# Patient Record
Sex: Male | Born: 1935 | Race: Black or African American | Hispanic: No | Marital: Married | Smoking: Former smoker
Health system: Southern US, Community
[De-identification: ages and names within clinical notes are randomized; demographics above are authoritative.]

## PROBLEM LIST (undated history)

## (undated) DIAGNOSIS — N189 Chronic kidney disease, unspecified: Secondary | ICD-10-CM

## (undated) DIAGNOSIS — E785 Hyperlipidemia, unspecified: Secondary | ICD-10-CM

## (undated) DIAGNOSIS — I10 Essential (primary) hypertension: Secondary | ICD-10-CM

## (undated) HISTORY — PX: EYE SURGERY: SHX253

## (undated) HISTORY — DX: Hyperlipidemia, unspecified: E78.5

## (undated) HISTORY — DX: Essential (primary) hypertension: I10

## (undated) HISTORY — DX: Chronic kidney disease, unspecified: N18.9

---

## 1998-12-02 ENCOUNTER — Emergency Department (HOSPITAL_COMMUNITY): Admission: EM | Admit: 1998-12-02 | Discharge: 1998-12-02 | Payer: Self-pay | Admitting: Emergency Medicine

## 2016-11-07 LAB — HM DIABETES EYE EXAM

## 2016-12-11 ENCOUNTER — Ambulatory Visit: Payer: Self-pay | Admitting: Internal Medicine

## 2017-01-09 ENCOUNTER — Other Ambulatory Visit (INDEPENDENT_AMBULATORY_CARE_PROVIDER_SITE_OTHER): Payer: Medicare Other

## 2017-01-09 ENCOUNTER — Ambulatory Visit (INDEPENDENT_AMBULATORY_CARE_PROVIDER_SITE_OTHER): Payer: Medicare Other | Admitting: Internal Medicine

## 2017-01-09 ENCOUNTER — Encounter: Payer: Self-pay | Admitting: Internal Medicine

## 2017-01-09 VITALS — BP 130/74 | HR 69 | Temp 98.7°F | Resp 16 | Ht 72.0 in | Wt 177.2 lb

## 2017-01-09 DIAGNOSIS — I129 Hypertensive chronic kidney disease with stage 1 through stage 4 chronic kidney disease, or unspecified chronic kidney disease: Secondary | ICD-10-CM

## 2017-01-09 DIAGNOSIS — N289 Disorder of kidney and ureter, unspecified: Secondary | ICD-10-CM | POA: Insufficient documentation

## 2017-01-09 DIAGNOSIS — E785 Hyperlipidemia, unspecified: Secondary | ICD-10-CM

## 2017-01-09 DIAGNOSIS — H4010X0 Unspecified open-angle glaucoma, stage unspecified: Secondary | ICD-10-CM | POA: Diagnosis not present

## 2017-01-09 DIAGNOSIS — E118 Type 2 diabetes mellitus with unspecified complications: Secondary | ICD-10-CM

## 2017-01-09 DIAGNOSIS — Z23 Encounter for immunization: Secondary | ICD-10-CM | POA: Diagnosis not present

## 2017-01-09 LAB — URINALYSIS, ROUTINE W REFLEX MICROSCOPIC
BILIRUBIN URINE: NEGATIVE
HGB URINE DIPSTICK: NEGATIVE
KETONES UR: NEGATIVE
LEUKOCYTES UA: NEGATIVE
NITRITE: NEGATIVE
RBC / HPF: NONE SEEN (ref 0–?)
Specific Gravity, Urine: <=1.005 — AB (ref 1.000–1.030)
TOTAL PROTEIN, URINE-UPE24: NEGATIVE
URINE GLUCOSE: NEGATIVE
UROBILINOGEN UA: 0.2 (ref 0.0–1.0)
pH: 7 (ref 5.0–8.0)

## 2017-01-09 LAB — CBC WITH DIFFERENTIAL/PLATELET
BASOS PCT: 0.5 % (ref 0.0–3.0)
Basophils Absolute: 0 10*3/uL (ref 0.0–0.1)
EOS PCT: 1.8 % (ref 0.0–5.0)
Eosinophils Absolute: 0.1 10*3/uL (ref 0.0–0.7)
HCT: 38.3 % — ABNORMAL LOW (ref 39.0–52.0)
HEMOGLOBIN: 12.6 g/dL — AB (ref 13.0–17.0)
LYMPHS ABS: 0.9 10*3/uL (ref 0.7–4.0)
Lymphocytes Relative: 15.7 % (ref 12.0–46.0)
MCHC: 33 g/dL (ref 30.0–36.0)
MCV: 95.9 fl (ref 78.0–100.0)
MONO ABS: 0.8 10*3/uL (ref 0.1–1.0)
Monocytes Relative: 12.6 % — ABNORMAL HIGH (ref 3.0–12.0)
NEUTROS ABS: 4.2 10*3/uL (ref 1.4–7.7)
NEUTROS PCT: 69.4 % (ref 43.0–77.0)
PLATELETS: 130 10*3/uL — AB (ref 150.0–400.0)
RBC: 3.99 Mil/uL — ABNORMAL LOW (ref 4.22–5.81)
RDW: 13.8 % (ref 11.5–15.5)
WBC: 6 10*3/uL (ref 4.0–10.5)

## 2017-01-09 LAB — COMPREHENSIVE METABOLIC PANEL
ALT: 12 U/L (ref 0–53)
AST: 13 U/L (ref 0–37)
Albumin: 4.2 g/dL (ref 3.5–5.2)
Alkaline Phosphatase: 63 U/L (ref 39–117)
BUN: 38 mg/dL — AB (ref 6–23)
CHLORIDE: 105 meq/L (ref 96–112)
CO2: 27 meq/L (ref 19–32)
Calcium: 10 mg/dL (ref 8.4–10.5)
Creatinine, Ser: 2.98 mg/dL — ABNORMAL HIGH (ref 0.40–1.50)
GFR: 25.84 mL/min — ABNORMAL LOW (ref >60.00)
GLUCOSE: 86 mg/dL (ref 70–99)
POTASSIUM: 4.1 meq/L (ref 3.5–5.1)
SODIUM: 141 meq/L (ref 135–145)
Total Bilirubin: 0.5 mg/dL (ref 0.2–1.2)
Total Protein: 7.3 g/dL (ref 6.0–8.3)

## 2017-01-09 LAB — MICROALBUMIN / CREATININE URINE RATIO
Creatinine,U: 35.9 mg/dL
MICROALB UR: 0.7 mg/dL (ref 0.0–1.9)
Microalb Creat Ratio: 2 mg/g (ref 0.0–30.0)

## 2017-01-09 LAB — LIPID PANEL
Cholesterol: 154 mg/dL (ref 0–200)
HDL: 54 mg/dL (ref >39.00)
LDL CALC: 85 mg/dL (ref 0–99)
NONHDL: 99.69
Total CHOL/HDL Ratio: 3
Triglycerides: 75 mg/dL (ref 0.0–149.0)
VLDL: 15 mg/dL (ref 0.0–40.0)

## 2017-01-09 LAB — HEMOGLOBIN A1C: Hgb A1c MFr Bld: 6.1 % (ref 4.6–6.5)

## 2017-01-09 LAB — THYROID PANEL WITH TSH
Free Thyroxine Index: 2 (ref 1.4–3.8)
T3 Uptake: 29 % (ref 22–35)
T4, Total: 6.9 ug/dL (ref 4.9–10.5)
TSH: 2.13 m[IU]/L (ref 0.40–4.50)

## 2017-01-09 NOTE — Patient Instructions (Signed)
Diabetes Mellitus and Food It is important for you to manage your blood sugar (glucose) level. Your blood glucose level can be greatly affected by what you eat. Eating healthier foods in the appropriate amounts throughout the day at about the same time each day will help you control your blood glucose level. It can also help slow or prevent worsening of your diabetes mellitus. Healthy eating may even help you improve the level of your blood pressure and reach or maintain a healthy weight. General recommendations for healthful eating and cooking habits include:  Eating meals and snacks regularly. Avoid going long periods of time without eating to lose weight.  Eating a diet that consists mainly of plant-based foods, such as fruits, vegetables, nuts, legumes, and whole grains.  Using low-heat cooking methods, such as baking, instead of high-heat cooking methods, such as deep frying.  Work with your dietitian to make sure you understand how to use the Nutrition Facts information on food labels. How can food affect me? Carbohydrates Carbohydrates affect your blood glucose level more than any other type of food. Your dietitian will help you determine how many carbohydrates to eat at each meal and teach you how to count carbohydrates. Counting carbohydrates is important to keep your blood glucose at a healthy level, especially if you are using insulin or taking certain medicines for diabetes mellitus. Alcohol Alcohol can cause sudden decreases in blood glucose (hypoglycemia), especially if you use insulin or take certain medicines for diabetes mellitus. Hypoglycemia can be a life-threatening condition. Symptoms of hypoglycemia (sleepiness, dizziness, and disorientation) are similar to symptoms of having too much alcohol. If your health care provider has given you approval to drink alcohol, do so in moderation and use the following guidelines:  Women should not have more than one drink per day, and men  should not have more than two drinks per day. One drink is equal to: ? 12 oz of beer. ? 5 oz of wine. ? 1 oz of hard liquor.  Do not drink on an empty stomach.  Keep yourself hydrated. Have water, diet soda, or unsweetened iced tea.  Regular soda, juice, and other mixers might contain a lot of carbohydrates and should be counted.  What foods are not recommended? As you make food choices, it is important to remember that all foods are not the same. Some foods have fewer nutrients per serving than other foods, even though they might have the same number of calories or carbohydrates. It is difficult to get your body what it needs when you eat foods with fewer nutrients. Examples of foods that you should avoid that are high in calories and carbohydrates but low in nutrients include:  Trans fats (most processed foods list trans fats on the Nutrition Facts label).  Regular soda.  Juice.  Candy.  Sweets, such as cake, pie, doughnuts, and cookies.  Fried foods.  What foods can I eat? Eat nutrient-rich foods, which will nourish your body and keep you healthy. The food you should eat also will depend on several factors, including:  The calories you need.  The medicines you take.  Your weight.  Your blood glucose level.  Your blood pressure level.  Your cholesterol level.  You should eat a variety of foods, including:  Protein. ? Lean cuts of meat. ? Proteins low in saturated fats, such as fish, egg whites, and beans. Avoid processed meats.  Fruits and vegetables. ? Fruits and vegetables that may help control blood glucose levels, such as apples,   mangoes, and yams.  Dairy products. ? Choose fat-free or low-fat dairy products, such as milk, yogurt, and cheese.  Grains, bread, pasta, and rice. ? Choose whole grain products, such as multigrain bread, whole oats, and brown rice. These foods may help control blood pressure.  Fats. ? Foods containing healthful fats, such as  nuts, avocado, olive oil, canola oil, and fish.  Does everyone with diabetes mellitus have the same meal plan? Because every person with diabetes mellitus is different, there is not one meal plan that works for everyone. It is very important that you meet with a dietitian who will help you create a meal plan that is just right for you. This information is not intended to replace advice given to you by your health care provider. Make sure you discuss any questions you have with your health care provider. Document Released: 12/07/2004 Document Revised: 08/18/2015 Document Reviewed: 02/06/2013 Elsevier Interactive Patient Education  2017 [employer]vier Inc.  

## 2017-01-09 NOTE — Progress Notes (Signed)
Subjective:  Patient ID: Cory Leonard, male    DOB: 01/10/1929  Age: 83 y.o. MRN: [id number]  CC: Hypertension; Hyperlipidemia; and Diabetes   HPI Cory Leonard presents for establishing as a new patient.  He recently moved here from [place] to live near his daughter.  He has a history of diabetes, hypertension, chronic kidney disease, and glaucoma.  He asked that I would refer him to a local ophthalmologist and nephrologist.  There are no old records from [place] today.  He feels well today.  He offers no complaints.  History Cory Leonard has a past medical history of Chronic kidney disease; Hyperlipidemia; and Hypertension.   He has no past surgical history on file.   His Family history is unknown by patient.He reports that he has never smoked. He has never used smokeless tobacco. He reports that he does not drink alcohol or use drugs.  No outpatient prescriptions prior to visit.   No facility-administered medications prior to visit.     ROS Review of Systems  Constitutional: Negative.  Negative for appetite change, diaphoresis, fatigue and unexpected weight change.  HENT: Negative.   Eyes: Negative for visual disturbance.  Respiratory: Negative for cough, chest tightness, shortness of breath and wheezing.   Cardiovascular: Negative for chest pain, palpitations and leg swelling.  Gastrointestinal: Negative for abdominal pain, constipation, diarrhea, nausea and vomiting.  Endocrine: Negative.  Negative for polyphagia and polyuria.  Genitourinary: Negative.  Negative for difficulty urinating.  Musculoskeletal: Negative.  Negative for back pain and myalgias.  Skin: Negative.  Negative for color change and rash.  Allergic/Immunologic: Negative.   Neurological: Negative.  Negative for dizziness, weakness, light-headedness and headaches.  Hematological: Negative for adenopathy. Does not bruise/bleed easily.  Psychiatric/Behavioral: Negative.     Objective:  BP 130/74 (BP  Location: Left Arm, Patient Position: Sitting, Cuff Size: Normal)   Pulse 69   Temp 98.7 F (37.1 C) (Oral)   Resp 16   Ht 6' (1.829 m)   Wt 177 lb 4 oz (80.4 kg)   SpO2 96%   BMI 24.04 kg/m   Physical Exam  Constitutional: He is oriented to person, place, and time. No distress.  HENT:  Mouth/Throat: Oropharynx is clear and moist. No oropharyngeal exudate.  Eyes: Conjunctivae are normal. Right eye exhibits no discharge. Left eye exhibits no discharge. No scleral icterus.  Neck: Normal range of motion. Neck supple. No JVD present. No thyromegaly present.  Cardiovascular: Normal rate, regular rhythm and intact distal pulses.  Exam reveals no gallop and no friction rub.   No murmur heard. Pulmonary/Chest: Effort normal and breath sounds normal. No respiratory distress. He has no wheezes. He has no rales. He exhibits no tenderness.  Abdominal: Soft. Bowel sounds are normal. He exhibits no distension and no mass. There is no tenderness. There is no rebound and no guarding.  Musculoskeletal: Normal range of motion. He exhibits no edema, tenderness or deformity.  Lymphadenopathy:    He has no cervical adenopathy.  Neurological: He is alert and oriented to person, place, and time.  Skin: Skin is warm and dry. No rash noted. He is not diaphoretic. No erythema. No pallor.  Vitals reviewed.     Lab Results  Component Value Date   WBC 6.0 01/09/2017   HGB 12.6 (L) 01/09/2017   HCT 38.3 (L) 01/09/2017   PLT 130.0 (L) 01/09/2017   GLUCOSE 86 01/09/2017   CHOL 154 01/09/2017   TRIG 75.0 01/09/2017   HDL 54.00 01/09/2017  LDLCALC 85 01/09/2017   ALT 12 01/09/2017   AST 13 01/09/2017   NA 141 01/09/2017   K 4.1 01/09/2017   CL 105 01/09/2017   CREATININE 2.98 (H) 01/09/2017   BUN 38 (H) 01/09/2017   CO2 27 01/09/2017   TSH 2.13 01/09/2017   HGBA1C 6.1 01/09/2017   MICROALBUR 0.7 01/09/2017     Assessment & Plan:   Cory Leonard was seen today for hypertension, hyperlipidemia and  diabetes.  Diagnoses and all orders for this visit:  Type 2 diabetes mellitus with complication, without long-term current use of insulin (HCC)-his A1c is at 6.1%.  His blood sugars are well controlled. -     Comprehensive metabolic panel; Future -     Hemoglobin A1c; Future -     Microalbumin / creatinine urine ratio; Future  Renal insufficiency, mild-his creatinine clearance is at 19.  He will avoid nephrotoxic agents.  I have referred him to a local nephrologist. -     Comprehensive metabolic panel; Future -     CBC with Differential/Platelet; Future -     Urinalysis, Routine w reflex microscopic; Future -     Ambulatory referral to Nephrology  Hyperlipidemia LDL goal <100-statin treatment is not indicated. -     Lipid panel; Future -     Thyroid Panel With TSH; Future  Hypertension, renal disease-his blood pressure is adequately well controlled. -     CBC with Differential/Platelet; Future  Open-angle glaucoma of both eyes, unspecified glaucoma stage, unspecified open-angle glaucoma type -     Ambulatory referral to Ophthalmology  Need for influenza vaccination -     Flu vaccine HIGH DOSE PF (Fluzone High dose)  Need for Tdap vaccination -     Tdap vaccine greater than or equal to 7yo IM   I have discontinued Cory Leonard amLODipine and hydrochlorothiazide. I am also having him maintain his lisinopril-hydrochlorothiazide, simvastatin, calcium citrate, calcium-vitamin D, cyanocobalamin, timolol, and neomycin-polymyxin b-dexamethasone.  Meds ordered this encounter  Medications  . DISCONTD: amLODipine (NORVASC) 5 MG tablet    Sig: Take 5 mg by mouth daily.    Refill:  1  . lisinopril-hydrochlorothiazide (PRINZIDE,ZESTORETIC) 20-25 MG tablet    Sig: TAKE 1/2 OF A TABLET BY MOUTH EVERY DAY    Refill:  1  . simvastatin (ZOCOR) 10 MG tablet    Sig: Take 10 mg by mouth daily.    Refill:  1  . DISCONTD: hydrochlorothiazide (HYDRODIURIL) 25 MG tablet    Sig: Take by mouth  daily.  . calcium citrate (CALCITRATE - DOSED IN MG ELEMENTAL CALCIUM) 950 MG tablet    Sig: Take 200 mg of elemental calcium by mouth daily.  . Calcium Carb-Cholecalciferol (CALCIUM-VITAMIN D) 500-200 MG-UNIT tablet    Sig: Take 1 tablet by mouth daily.  . cyanocobalamin 500 MCG tablet    Sig: Take 500 mcg by mouth daily.  . timolol (BETIMOL) 0.5 % ophthalmic solution    Sig: 1 drop 2 (two) times daily.  Marland Kitchen neomycin-polymyxin b-dexamethasone (MAXITROL) 3.5-10000-0.1 OINT    Sig: 1 application.     Follow-up: Return in about 4 months (around 05/12/2017).  Scarlette Calico, MD

## 2017-01-10 ENCOUNTER — Encounter: Payer: Self-pay | Admitting: Internal Medicine

## 2017-01-15 ENCOUNTER — Encounter: Payer: Self-pay | Admitting: Internal Medicine

## 2017-02-06 ENCOUNTER — Other Ambulatory Visit: Payer: Self-pay | Admitting: Internal Medicine

## 2017-02-06 ENCOUNTER — Telehealth: Payer: Self-pay

## 2017-02-06 NOTE — Telephone Encounter (Signed)
TJ-Pt wife asked for a referral to a kidney doctor/he saw one prior to moving here and is in need of one here in [place]/plz advise/thx dmf

## 2017-02-06 NOTE — Telephone Encounter (Signed)
This has been ordered per TJ/thx dmf

## 2017-02-06 NOTE — Telephone Encounter (Signed)
I entered this referral request 1 month ago

## 2017-02-06 NOTE — Telephone Encounter (Signed)
Spoke with Museum/gallery conservator at [employer] [employer] and pt is rated a 3 and it still could be a few weeks before the pt is called to schedule an appointment. Pt is aware

## 2017-02-06 NOTE — Telephone Encounter (Signed)
Referral was sent to [facility] on 10/26. I called to follow up and left a msg on their referral coordinator's vm to call me back.

## 2017-02-06 NOTE — Telephone Encounter (Signed)
LMOVM that someone is calling the kidney office to advise on referral process/thx dmf

## 2017-02-26 LAB — HM DIABETES EYE EXAM

## 2017-03-07 ENCOUNTER — Encounter: Payer: Self-pay | Admitting: Internal Medicine

## 2017-05-14 ENCOUNTER — Ambulatory Visit: Payer: Medicare Other | Admitting: Internal Medicine

## 2017-05-14 LAB — HEPATIC FUNCTION PANEL
ALT: 10 (ref 10–40)
AST: 11 — AB (ref 14–40)
Alkaline Phosphatase: 72 (ref 25–125)

## 2017-05-14 LAB — VITAMIN D 25 HYDROXY (VIT D DEFICIENCY, FRACTURES): Vit D, 25-Hydroxy: 49.8

## 2017-05-14 LAB — CBC AND DIFFERENTIAL
HCT: 36 — AB (ref 41–53)
Hemoglobin: 12.4 — AB (ref 13.5–17.5)
Platelets: 128 — AB (ref 150–399)
WBC: 5.4

## 2017-05-14 LAB — BASIC METABOLIC PANEL
BUN: 38 — AB (ref 4–21)
CREATININE: 3.1 — AB (ref 0.6–1.3)
Potassium: 4.6 (ref 3.4–5.3)
Sodium: 138 (ref 137–147)

## 2017-05-14 LAB — IRON,TIBC AND FERRITIN PANEL
Iron: 85
TIBC: 248
UIBC: 163

## 2017-05-16 ENCOUNTER — Ambulatory Visit: Payer: Medicare Other | Admitting: Internal Medicine

## 2017-05-30 ENCOUNTER — Ambulatory Visit: Payer: Medicare Other | Admitting: Internal Medicine

## 2017-05-30 ENCOUNTER — Encounter: Payer: Self-pay | Admitting: Internal Medicine

## 2017-05-30 VITALS — BP 148/78 | HR 78 | Temp 98.0°F | Resp 16 | Ht 72.0 in | Wt 175.5 lb

## 2017-05-30 DIAGNOSIS — Z23 Encounter for immunization: Secondary | ICD-10-CM

## 2017-05-30 DIAGNOSIS — I129 Hypertensive chronic kidney disease with stage 1 through stage 4 chronic kidney disease, or unspecified chronic kidney disease: Secondary | ICD-10-CM | POA: Diagnosis not present

## 2017-05-30 DIAGNOSIS — D539 Nutritional anemia, unspecified: Secondary | ICD-10-CM | POA: Diagnosis not present

## 2017-05-30 DIAGNOSIS — E118 Type 2 diabetes mellitus with unspecified complications: Secondary | ICD-10-CM | POA: Diagnosis not present

## 2017-05-30 NOTE — Progress Notes (Signed)
Subjective:  Patient ID: Cory Leonard, male    DOB: 1929/03/07  Age: 84 y.o. MRN: [id number]  CC: Anemia and Hypertension   HPI Cory Leonard presents for f/up - He tells me that he feels well and offers no complaints.  He thinks his blood pressure has been well controlled.  He is not symptomatic with respect to the anemia.  He is not aware of any sources of blood loss.  He denies fatigue, edema, paresthesias, weakness, or shortness of breath.  Outpatient Medications Prior to Visit  Medication Sig Dispense Refill  . Calcium Carb-Cholecalciferol (CALCIUM-VITAMIN D) 500-200 MG-UNIT tablet Take 1 tablet by mouth daily.    . calcium citrate (CALCITRATE - DOSED IN MG ELEMENTAL CALCIUM) 950 MG tablet Take 200 mg of elemental calcium by mouth daily.    . cyanocobalamin 500 MCG tablet Take 500 mcg by mouth daily.    Marland Kitchen lisinopril-hydrochlorothiazide (PRINZIDE,ZESTORETIC) 20-25 MG tablet TAKE 1/2 OF A TABLET BY MOUTH EVERY DAY  1  . simvastatin (ZOCOR) 10 MG tablet Take 10 mg by mouth daily.  1  . timolol (BETIMOL) 0.5 % ophthalmic solution 1 drop 2 (two) times daily.    Marland Kitchen neomycin-polymyxin b-dexamethasone (MAXITROL) 2.5-05397-6.7 OINT 1 application.     No facility-administered medications prior to visit.     ROS Review of Systems  Constitutional: Negative.  Negative for appetite change, chills, diaphoresis, fatigue and unexpected weight change.  HENT: Negative.   Eyes: Negative.   Respiratory: Negative for cough, chest tightness, shortness of breath and wheezing.   Cardiovascular: Negative.  Negative for chest pain, palpitations and leg swelling.  Gastrointestinal: Negative for abdominal pain, constipation, diarrhea, nausea and vomiting.  Endocrine: Negative.   Genitourinary: Negative.  Negative for decreased urine volume, difficulty urinating, dysuria, hematuria and urgency.  Musculoskeletal: Negative.  Negative for arthralgias and back pain.  Skin: Negative for color change and rash.   Neurological: Negative.  Negative for dizziness, weakness and light-headedness.  Hematological: Negative for adenopathy. Does not bruise/bleed easily.  Psychiatric/Behavioral: Negative.     Objective:  BP (!) 148/78 (BP Location: Left Arm, Patient Position: Sitting, Cuff Size: Large)   Pulse 78   Temp 98 F (36.7 C) (Oral)   Resp 16   Ht 6' (1.829 m)   Wt 175 lb 8 oz (79.6 kg)   SpO2 99%   BMI 23.80 kg/m   BP Readings from Last 3 Encounters:  05/30/17 (!) 148/78  01/09/17 130/74    Wt Readings from Last 3 Encounters:  05/30/17 175 lb 8 oz (79.6 kg)  01/09/17 177 lb 4 oz (80.4 kg)    Physical Exam  Constitutional: He is oriented to person, place, and time. No distress.  HENT:  Mouth/Throat: Oropharynx is clear and moist. No oropharyngeal exudate.  Eyes: Conjunctivae are normal. Left eye exhibits no discharge. No scleral icterus.  Neck: Normal range of motion. Neck supple. No JVD present. No thyromegaly present.  Cardiovascular: Normal rate, regular rhythm and normal heart sounds. Exam reveals no gallop.  No murmur heard. Pulmonary/Chest: Effort normal and breath sounds normal. No respiratory distress. He has no wheezes. He has no rales.  Abdominal: Soft. Bowel sounds are normal. He exhibits no distension and no mass. There is no tenderness. There is no guarding.  Musculoskeletal: Normal range of motion. He exhibits no edema, tenderness or deformity.  Lymphadenopathy:    He has no cervical adenopathy.  Neurological: He is alert and oriented to person, place, and time.  Skin:  Skin is warm and dry. No rash noted. He is not diaphoretic. No erythema. No pallor.  Vitals reviewed.   Lab Results  Component Value Date   WBC 6.0 01/09/2017   HGB 12.6 (L) 01/09/2017   HCT 38.3 (L) 01/09/2017   PLT 130.0 (L) 01/09/2017   GLUCOSE 86 01/09/2017   CHOL 154 01/09/2017   TRIG 75.0 01/09/2017   HDL 54.00 01/09/2017   LDLCALC 85 01/09/2017   ALT 12 01/09/2017   AST 13  01/09/2017   NA 141 01/09/2017   K 4.1 01/09/2017   CL 105 01/09/2017   CREATININE 2.98 (H) 01/09/2017   BUN 38 (H) 01/09/2017   CO2 27 01/09/2017   TSH 2.13 01/09/2017   HGBA1C 6.1 01/09/2017   MICROALBUR 0.7 01/09/2017    No results found.  Assessment & Plan:   Cory Leonard was seen today for anemia and hypertension.  Diagnoses and all orders for this visit:  Type 2 diabetes mellitus with complication, without long-term current use of insulin ([place])-  His last A1c was 6.1%.  Medical therapy is not indicated. -     Basic metabolic panel; Future  Hypertension, renal disease- I will recheck his renal function today.  He is avoiding nephrotoxic agents.  He is asymptomatic with respect to this. -     Basic metabolic panel; Future -     CBC with Differential/Platelet; Future  Deficiency anemia- I will recheck his H&H and will screen him for vitamin deficiencies. -     IBC panel; Future -     Vitamin B12; Future -     Folate; Future -     Ferritin; Future -     Vitamin B1; Future  Other orders -     Pneumococcal polysaccharide vaccine 23-valent greater than or equal to 2yo subcutaneous/IM   I have discontinued Cory Leonard's neomycin-polymyxin b-dexamethasone. I am also having him maintain his lisinopril-hydrochlorothiazide, simvastatin, calcium citrate, calcium-vitamin D, cyanocobalamin, and timolol.  No orders of the defined types were placed in this encounter.    Follow-up: Return in about 6 months (around 11/30/2017).  Scarlette Calico, MD

## 2017-05-30 NOTE — Patient Instructions (Signed)

## 2017-06-08 ENCOUNTER — Ambulatory Visit: Payer: Medicare Other | Admitting: Family

## 2017-06-08 ENCOUNTER — Encounter: Payer: Self-pay | Admitting: Family

## 2017-06-08 VITALS — BP 130/78 | HR 83 | Temp 97.9°F | Resp 14 | Ht 72.0 in | Wt 175.0 lb

## 2017-06-08 DIAGNOSIS — J40 Bronchitis, not specified as acute or chronic: Secondary | ICD-10-CM | POA: Diagnosis not present

## 2017-06-08 MED ORDER — GUAIFENESIN ER 600 MG PO TB12: 1200.0000 mg | ORAL_TABLET | Freq: Two times a day (BID) | ORAL | 0 refills | Status: DC | PRN

## 2017-06-08 MED ORDER — BENZONATATE 100 MG PO CAPS: 100.0000 mg | ORAL_CAPSULE | Freq: Three times a day (TID) | ORAL | 0 refills | Status: DC | PRN

## 2017-06-08 NOTE — Patient Instructions (Addendum)
Start mucinex ( plain) ; this medication as an expectorant and will help break up thick mucus you are describing.  It is important however to drink plenty of water with this medication to make sure that it works.  Start tessalon; a medication for cough; you  may take this up to 3 times a day.  As discussed, I suspect that your illness is a virus at this time warranting conservative therapy.  Please call us if conservative therapy at home as above is not effective   Acute Bronchitis, Adult Acute bronchitis is sudden (acute) swelling of the air tubes (bronchi) in the lungs. Acute bronchitis causes these tubes to fill with mucus, which can make it hard to breathe. It can also cause coughing or wheezing. In adults, acute bronchitis usually goes away within 2 weeks. A cough caused by bronchitis may last up to 3 weeks. Smoking, allergies, and asthma can make the condition worse. Repeated episodes of bronchitis may cause further lung problems, such as chronic obstructive pulmonary disease (COPD). What are the causes? This condition can be caused by germs and by substances that irritate the lungs, including:  Cold and flu viruses. This condition is most often caused by the same virus that causes a cold.  Bacteria.  Exposure to tobacco smoke, dust, fumes, and air pollution.  What increases the risk? This condition is more likely to develop in people who:  Have close contact with someone with acute bronchitis.  Are exposed to lung irritants, such as tobacco smoke, dust, fumes, and vapors.  Have a weak immune system.  Have a respiratory condition such as asthma.  What are the signs or symptoms? Symptoms of this condition include:  A cough.  Coughing up clear, yellow, or green mucus.  Wheezing.  Chest congestion.  Shortness of breath.  A fever.  Body aches.  Chills.  A sore throat.  How is this diagnosed? This condition is usually diagnosed with a physical exam. During the  exam, your health care provider may order tests, such as chest X-rays, to rule out other conditions. He or she may also:  Test a sample of your mucus for bacterial infection.  Check the level of oxygen in your blood. This is done to check for pneumonia.  Do a chest X-ray or lung function testing to rule out pneumonia and other conditions.  Perform blood tests.  Your health care provider will also ask about your symptoms and medical history. How is this treated? Most cases of acute bronchitis clear up over time without treatment. Your health care provider may recommend:  Drinking more fluids. Drinking more makes your mucus thinner, which may make it easier to breathe.  Taking a medicine for a fever or cough.  Taking an antibiotic medicine.  Using an inhaler to help improve shortness of breath and to control a cough.  Using a cool mist vaporizer or humidifier to make it easier to breathe.  Follow these instructions at home: Medicines  Take over-the-counter and prescription medicines only as told by your health care provider.  If you were prescribed an antibiotic, take it as told by your health care provider. Do not stop taking the antibiotic even if you start to feel better. General instructions  Get plenty of rest.  Drink enough fluids to keep your urine clear or pale yellow.  Avoid smoking and secondhand smoke. Exposure to cigarette smoke or irritating chemicals will make bronchitis worse. If you smoke and you need help quitting, ask your health care  provider. Quitting smoking will help your lungs heal faster.  Use an inhaler, cool mist vaporizer, or humidifier as told by your health care provider.  Keep all follow-up visits as told by your health care provider. This is important. How is this prevented? To lower your risk of getting this condition again:  Wash your hands often with soap and water. If soap and water are not available, use hand sanitizer.  Avoid contact  with people who have cold symptoms.  Try not to touch your hands to your mouth, nose, or eyes.  Make sure to get the flu shot every year.  Contact a health care provider if:  Your symptoms do not improve in 2 weeks of treatment. Get help right away if:  You cough up blood.  You have chest pain.  You have severe shortness of breath.  You become dehydrated.  You faint or keep feeling like you are going to faint.  You keep vomiting.  You have a severe headache.  Your fever or chills gets worse. This information is not intended to replace advice given to you by your health care provider. Make sure you discuss any questions you have with your health care provider. Document Released: 04/19/2004 Document Revised: 10/05/2015 Document Reviewed: 08/31/2015 Elsevier Interactive Patient Education  [employer].

## 2017-06-08 NOTE — Progress Notes (Signed)
Subjective:    Patient ID: Cory Leonard, male    DOB: Dec 28, 1928, 84 y.o.   MRN: [id number]  CC: JERMINE BIBBEE is a 84 y.o. male who presents today for an acute visit.    HPI: Chief complaint dry cough times 3 days, unchanged. No wheezing, sob. Endorses some phelgm, nasal congestion.  NO ear pain, myalgia, fever, sinus pain.  Hasnt tried any medication over the counter.  NO recent colds.    No history of lung disease or smoking HISTORY:  Past Medical History:  Diagnosis Date  . Chronic kidney disease   . Hyperlipidemia   . Hypertension    History reviewed. No pertinent surgical history. Family History  Family history unknown: Yes    Allergies: Penicillins and Amlodipine Current Outpatient Medications on File Prior to Visit  Medication Sig Dispense Refill  . Calcium Carb-Cholecalciferol (CALCIUM-VITAMIN D) 500-200 MG-UNIT tablet Take 1 tablet by mouth daily.    . calcium citrate (CALCITRATE - DOSED IN MG ELEMENTAL CALCIUM) 950 MG tablet Take 200 mg of elemental calcium by mouth daily.    . cyanocobalamin 500 MCG tablet Take 500 mcg by mouth daily.    Marland Kitchen lisinopril-hydrochlorothiazide (PRINZIDE,ZESTORETIC) 20-25 MG tablet TAKE 1/2 OF A TABLET BY MOUTH EVERY DAY  1  . simvastatin (ZOCOR) 10 MG tablet Take 10 mg by mouth daily.  1  . timolol (BETIMOL) 0.5 % ophthalmic solution 1 drop 2 (two) times daily.     No current facility-administered medications on file prior to visit.     Social History   Tobacco Use  . Smoking status: Never Smoker  . Smokeless tobacco: Never Used  Substance Use Topics  . Alcohol use: No  . Drug use: No    Review of Systems  Constitutional: Negative for chills and fever.  HENT: Positive for congestion. Negative for sinus pain and sore throat.   Respiratory: Positive for cough. Negative for shortness of breath and wheezing.   Cardiovascular: Negative for chest pain and palpitations.  Gastrointestinal: Negative for nausea and vomiting.    Musculoskeletal: Negative for myalgias.      Objective:    BP 130/78 (BP Location: Left Arm, Patient Position: Sitting, Cuff Size: Normal)   Pulse 83   Temp 97.9 F (36.6 C) (Oral)   Resp 14   Ht 6' (1.829 m)   Wt 175 lb (79.4 kg)   SpO2 98%   BMI 23.73 kg/m    Physical Exam  Constitutional: Vital signs are normal. He appears well-developed and well-nourished.  HENT:  Head: Normocephalic and atraumatic.  Right Ear: Hearing, tympanic membrane, external ear and ear canal normal. No drainage, swelling or tenderness. Tympanic membrane is not injected, not erythematous and not bulging. No middle ear effusion. No decreased hearing is noted.  Left Ear: Hearing, tympanic membrane, external ear and ear canal normal. No drainage, swelling or tenderness. Tympanic membrane is not injected, not erythematous and not bulging.  No middle ear effusion. No decreased hearing is noted.  Nose: Nose normal. Right sinus exhibits no maxillary sinus tenderness and no frontal sinus tenderness. Left sinus exhibits no maxillary sinus tenderness and no frontal sinus tenderness.  Mouth/Throat: Uvula is midline, oropharynx is clear and moist and mucous membranes are normal. No oropharyngeal exudate, posterior oropharyngeal edema, posterior oropharyngeal erythema or tonsillar abscesses.  Eyes: Conjunctivae are normal.  Cardiovascular: Regular rhythm and normal heart sounds.  Pulmonary/Chest: Effort normal and breath sounds normal. No respiratory distress. He has no wheezes. He has no  rhonchi. He has no rales.  Lymphadenopathy:       Head (right side): No submental, no submandibular, no tonsillar, no preauricular, no posterior auricular and no occipital adenopathy present.       Head (left side): No submental, no submandibular, no tonsillar, no preauricular, no posterior auricular and no occipital adenopathy present.    He has no cervical adenopathy.  Neurological: He is alert.  Skin: Skin is warm and dry.   Psychiatric: He has a normal mood and affect. His speech is normal and behavior is normal.  Vitals reviewed.      Assessment & Plan:   1. Bronchitis Patient is well-appearing today.  Afebrile.  No acute respiratory distress.  We discussed today likely viral URI and conservative therapy is appropriate.  Advised him to start Tessalon for cough and  Mucinex.  Advised patient to let us know if no improvement.   - benzonatate (TESSALON PERLES) 100 MG capsule; Take 1 capsule (100 mg total) by mouth 3 (three) times daily as needed for cough.  Dispense: 30 capsule; Refill: 0 - guaiFENesin (MUCINEX) 600 MG 12 hr tablet; Take 2 tablets (1,200 mg total) by mouth 2 (two) times daily as needed for cough or to loosen phlegm. Take for 3-7 days as symptoms persist  Dispense: 60 tablet; Refill: 0    I am having Cory Leonard start on benzonatate and guaiFENesin. I am also having him maintain his lisinopril-hydrochlorothiazide, simvastatin, calcium citrate, calcium-vitamin D, cyanocobalamin, and timolol.   Meds ordered this encounter  Medications  . benzonatate (TESSALON PERLES) 100 MG capsule    Sig: Take 1 capsule (100 mg total) by mouth 3 (three) times daily as needed for cough.    Dispense:  30 capsule    Refill:  0    Order Specific Question:   Supervising Provider    Answer:   Deborra Medina L [[id number]]  . guaiFENesin (MUCINEX) 600 MG 12 hr tablet    Sig: Take 2 tablets (1,200 mg total) by mouth 2 (two) times daily as needed for cough or to loosen phlegm. Take for 3-7 days as symptoms persist    Dispense:  60 tablet    Refill:  0    Order Specific Question:   Supervising Provider    Answer:   Crecencio Mc [[id number]]    Return precautions given.   Risks, benefits, and alternatives of the medications and treatment plan prescribed today were discussed, and patient expressed understanding.   Education regarding symptom management and diagnosis given to patient on AVS.  Continue to follow with  Janith Lima, MD for routine health maintenance.   Cory Leonard and I agreed with plan.   Mable Paris, FNP

## 2017-06-08 NOTE — Progress Notes (Signed)
Pre visit review using our clinic review tool, if applicable. No additional management support is needed unless otherwise documented below in the visit note. 

## 2017-06-21 ENCOUNTER — Telehealth: Payer: Self-pay | Admitting: Internal Medicine

## 2017-06-21 NOTE — Telephone Encounter (Signed)
Patient is requesting a refill of these medication. He states his other provider use to fill them and now he out. He had spoke with you about refilling these once he ran out.    lisinopril-hydrochlorothiazide (PRINZIDE,ZESTORETIC) 20-25 MG tablet  simvastatin (ZOCOR) 10 MG tablet   [facility] [place] [address] [phone] (Phone) [phone] (Fax)

## 2017-06-24 MED ORDER — LISINOPRIL-HYDROCHLOROTHIAZIDE 20-25 MG PO TABS: 0.5000 | ORAL_TABLET | Freq: Every day | ORAL | 1 refills | Status: DC

## 2017-06-24 MED ORDER — SIMVASTATIN 10 MG PO TABS: 10.0000 mg | ORAL_TABLET | Freq: Every day | ORAL | 1 refills | Status: DC

## 2017-06-24 NOTE — Telephone Encounter (Signed)
erx sent

## 2017-11-28 ENCOUNTER — Encounter: Payer: Self-pay | Admitting: Internal Medicine

## 2017-11-28 ENCOUNTER — Ambulatory Visit: Payer: Medicare Other | Admitting: Internal Medicine

## 2017-11-28 ENCOUNTER — Other Ambulatory Visit (INDEPENDENT_AMBULATORY_CARE_PROVIDER_SITE_OTHER): Payer: Medicare Other

## 2017-11-28 VITALS — BP 142/80 | HR 76 | Temp 97.6°F | Ht 72.0 in | Wt 180.0 lb

## 2017-11-28 DIAGNOSIS — E785 Hyperlipidemia, unspecified: Secondary | ICD-10-CM

## 2017-11-28 DIAGNOSIS — N289 Disorder of kidney and ureter, unspecified: Secondary | ICD-10-CM

## 2017-11-28 DIAGNOSIS — E118 Type 2 diabetes mellitus with unspecified complications: Secondary | ICD-10-CM

## 2017-11-28 DIAGNOSIS — M170 Bilateral primary osteoarthritis of knee: Secondary | ICD-10-CM | POA: Insufficient documentation

## 2017-11-28 DIAGNOSIS — I129 Hypertensive chronic kidney disease with stage 1 through stage 4 chronic kidney disease, or unspecified chronic kidney disease: Secondary | ICD-10-CM | POA: Diagnosis not present

## 2017-11-28 LAB — CBC WITH DIFFERENTIAL/PLATELET
Basophils Absolute: 0 10*3/uL (ref 0.0–0.1)
Basophils Relative: 0.6 % (ref 0.0–3.0)
Eosinophils Absolute: 0.1 10*3/uL (ref 0.0–0.7)
Eosinophils Relative: 1.6 % (ref 0.0–5.0)
HCT: 38 % — ABNORMAL LOW (ref 39.0–52.0)
HEMOGLOBIN: 12.7 g/dL — AB (ref 13.0–17.0)
LYMPHS PCT: 11 % — AB (ref 12.0–46.0)
Lymphs Abs: 0.7 10*3/uL (ref 0.7–4.0)
MCHC: 33.3 g/dL (ref 30.0–36.0)
MCV: 93.2 fl (ref 78.0–100.0)
MONOS PCT: 12 % (ref 3.0–12.0)
Monocytes Absolute: 0.8 10*3/uL (ref 0.1–1.0)
Neutro Abs: 4.8 10*3/uL (ref 1.4–7.7)
Neutrophils Relative %: 74.8 % (ref 43.0–77.0)
Platelets: 136 10*3/uL — ABNORMAL LOW (ref 150.0–400.0)
RBC: 4.08 Mil/uL — AB (ref 4.22–5.81)
RDW: 13.4 % (ref 11.5–15.5)
WBC: 6.4 10*3/uL (ref 4.0–10.5)

## 2017-11-28 LAB — COMPREHENSIVE METABOLIC PANEL
ALBUMIN: 4.2 g/dL (ref 3.5–5.2)
ALK PHOS: 79 U/L (ref 39–117)
ALT: 12 U/L (ref 0–53)
AST: 13 U/L (ref 0–37)
BUN: 32 mg/dL — ABNORMAL HIGH (ref 6–23)
CO2: 30 mEq/L (ref 19–32)
Calcium: 9.6 mg/dL (ref 8.4–10.5)
Chloride: 105 mEq/L (ref 96–112)
Creatinine, Ser: 2.84 mg/dL — ABNORMAL HIGH (ref 0.40–1.50)
GFR: 27.26 mL/min — AB (ref >60.00)
Glucose, Bld: 101 mg/dL — ABNORMAL HIGH (ref 70–99)
POTASSIUM: 4.7 meq/L (ref 3.5–5.1)
SODIUM: 140 meq/L (ref 135–145)
TOTAL PROTEIN: 7.2 g/dL (ref 6.0–8.3)
Total Bilirubin: 0.5 mg/dL (ref 0.2–1.2)

## 2017-11-28 LAB — URINALYSIS, ROUTINE W REFLEX MICROSCOPIC
BILIRUBIN URINE: NEGATIVE
Ketones, ur: NEGATIVE
Leukocytes, UA: NEGATIVE
NITRITE: NEGATIVE
Specific Gravity, Urine: 1.015 (ref 1.000–1.030)
Total Protein, Urine: NEGATIVE
Urine Glucose: NEGATIVE
Urobilinogen, UA: 0.2 (ref 0.0–1.0)
pH: 6 (ref 5.0–8.0)

## 2017-11-28 LAB — LIPID PANEL
CHOLESTEROL: 157 mg/dL (ref 0–200)
HDL: 46.6 mg/dL (ref >39.00)
LDL CALC: 91 mg/dL (ref 0–99)
NonHDL: 109.92
Total CHOL/HDL Ratio: 3
Triglycerides: 94 mg/dL (ref 0.0–149.0)
VLDL: 18.8 mg/dL (ref 0.0–40.0)

## 2017-11-28 LAB — HEMOGLOBIN A1C: HEMOGLOBIN A1C: 6.3 % (ref 4.6–6.5)

## 2017-11-28 MED ORDER — TRAMADOL HCL 50 MG PO TABS: 50.0000 mg | ORAL_TABLET | Freq: Two times a day (BID) | ORAL | 1 refills | Status: DC | PRN

## 2017-11-28 NOTE — Progress Notes (Signed)
Subjective:  Patient ID: Cory Leonard, male    DOB: 12/03/1928  Age: 84 y.o. MRN: [id number]  CC: Hypertension; Hypothyroidism; Anemia; Diabetes; and Osteoarthritis   HPI Cory Leonard presents for f/up - He complains of worsening, bilateral knee pain with no recent trauma or injury.  The knees do not get red or swollen.  He has tried to control the discomfort with Tylenol without much relief from his symptoms. The pain interferes with his activities of daily living.  He wants something prescribed for the pain.  He cannot take NSAIDs due to renal insufficiency.  He tells me he recently saw his kidney doctor and there is some discussion about him possibly starting dialysis.  Outpatient Medications Prior to Visit  Medication Sig Dispense Refill  . Calcium Carb-Cholecalciferol (CALCIUM-VITAMIN D) 500-200 MG-UNIT tablet Take 1 tablet by mouth daily.    . cyanocobalamin 500 MCG tablet Take 500 mcg by mouth daily.    Marland Kitchen lisinopril-hydrochlorothiazide (PRINZIDE,ZESTORETIC) 20-25 MG tablet Take 0.5 tablets by mouth daily. 90 tablet 1  . simvastatin (ZOCOR) 10 MG tablet Take 1 tablet (10 mg total) by mouth daily. 90 tablet 1  . timolol (BETIMOL) 0.5 % ophthalmic solution 1 drop 2 (two) times daily.    . benzonatate (TESSALON PERLES) 100 MG capsule Take 1 capsule (100 mg total) by mouth 3 (three) times daily as needed for cough. 30 capsule 0  . calcium citrate (CALCITRATE - DOSED IN MG ELEMENTAL CALCIUM) 950 MG tablet Take 200 mg of elemental calcium by mouth daily.    Marland Kitchen guaiFENesin (MUCINEX) 600 MG 12 hr tablet Take 2 tablets (1,200 mg total) by mouth 2 (two) times daily as needed for cough or to loosen phlegm. Take for 3-7 days as symptoms persist 60 tablet 0   No facility-administered medications prior to visit.     ROS Review of Systems  Constitutional: Negative.  Negative for diaphoresis, fatigue and unexpected weight change.  HENT: Negative.   Respiratory: Negative.  Negative for chest  tightness, shortness of breath and wheezing.   Cardiovascular: Negative for chest pain, palpitations and leg swelling.  Gastrointestinal: Negative for abdominal pain, constipation, diarrhea, nausea and vomiting.  Endocrine: Negative.   Genitourinary: Negative.  Negative for difficulty urinating, dysuria and hematuria.  Musculoskeletal: Positive for arthralgias. Negative for back pain, myalgias and neck pain.  Skin: Negative.  Negative for color change, pallor and rash.  Neurological: Negative.  Negative for dizziness, weakness, light-headedness and headaches.  Hematological: Negative for adenopathy. Does not bruise/bleed easily.  Psychiatric/Behavioral: Negative.     Objective:  BP (!) 142/80 (BP Location: Left Arm, Patient Position: Sitting, Cuff Size: Normal)   Pulse 76   Temp 97.6 F (36.4 C) (Oral)   Ht 6' (1.829 m)   Wt 180 lb (81.6 kg)   SpO2 96%   BMI 24.41 kg/m   BP Readings from Last 3 Encounters:  11/28/17 (!) 142/80  06/08/17 130/78  05/30/17 (!) 148/78    Wt Readings from Last 3 Encounters:  11/28/17 180 lb (81.6 kg)  06/08/17 175 lb (79.4 kg)  05/30/17 175 lb 8 oz (79.6 kg)    Physical Exam  Constitutional: He is oriented to person, place, and time. No distress.  HENT:  Mouth/Throat: Oropharynx is clear and moist. No oropharyngeal exudate.  Eyes: Conjunctivae are normal. No scleral icterus.  Neck: Normal range of motion. Neck supple. No JVD present. No thyromegaly present.  Cardiovascular: Normal rate, regular rhythm and normal heart sounds. Exam reveals  no gallop and no friction rub.  No murmur heard. Pulmonary/Chest: Effort normal and breath sounds normal. No respiratory distress. He has no wheezes. He has no rales.  Abdominal: Soft. Normal appearance and bowel sounds are normal. He exhibits no mass. There is no hepatosplenomegaly. There is no tenderness.  Musculoskeletal: Normal range of motion. He exhibits no edema, tenderness or deformity.       Right  knee: He exhibits normal range of motion, no swelling, no effusion, no erythema and no bony tenderness. Deformity: DJD. No tenderness found.       Left knee: He exhibits normal range of motion, no swelling, no effusion, no erythema and no bony tenderness. Deformity: DJD. No tenderness found.  Lymphadenopathy:    He has no cervical adenopathy.  Neurological: He is alert and oriented to person, place, and time.  Skin: Skin is warm and dry. No rash noted. He is not diaphoretic.    Lab Results  Component Value Date   WBC 6.4 11/28/2017   HGB 12.7 (L) 11/28/2017   HCT 38.0 (L) 11/28/2017   PLT 136.0 (L) 11/28/2017   GLUCOSE 101 (H) 11/28/2017   CHOL 157 11/28/2017   TRIG 94.0 11/28/2017   HDL 46.60 11/28/2017   LDLCALC 91 11/28/2017   ALT 12 11/28/2017   AST 13 11/28/2017   NA 140 11/28/2017   K 4.7 11/28/2017   CL 105 11/28/2017   CREATININE 2.84 (H) 11/28/2017   BUN 32 (H) 11/28/2017   CO2 30 11/28/2017   TSH 2.13 01/09/2017   HGBA1C 6.3 11/28/2017   MICROALBUR 0.7 01/09/2017    No results found.  Assessment & Plan:   Cory Leonard was seen today for hypertension, hypothyroidism, anemia, diabetes and osteoarthritis.  Diagnoses and all orders for this visit:  Renal insufficiency, mild- His renal function is slightly improved, he will avoid nephrotoxic agents and will cont to f/up with nephrology. -     CBC with Differential/Platelet; Future -     Comprehensive metabolic panel; Future -     Urinalysis, Routine w reflex microscopic; Future  Hypertension, renal disease- His BP is adequately well controlled. -     Comprehensive metabolic panel; Future -     Urinalysis, Routine w reflex microscopic; Future  Hyperlipidemia LDL goal <100- He has achieved his LDL goal and is doing well on the statin. -     Lipid panel; Future  Type 2 diabetes mellitus with complication, without long-term current use of insulin ([place])- His A1C is at 6.3%. His blood sugars are well controlled. -      Urinalysis, Routine w reflex microscopic; Future -     Hemoglobin A1c; Future  Primary osteoarthritis of both knees- He can't taken NSAIDS, will try tramadol as needed for pain. -     traMADol (ULTRAM) 50 MG tablet; Take 1 tablet (50 mg total) by mouth every 12 (twelve) hours as needed.   I have discontinued Shilo A. Pinnix's calcium citrate, benzonatate, and guaiFENesin. I am also having him start on traMADol. Additionally, I am having him maintain his calcium-vitamin D, vitamin B-12, timolol, lisinopril-hydrochlorothiazide, and simvastatin.  Meds ordered this encounter  Medications  . traMADol (ULTRAM) 50 MG tablet    Sig: Take 1 tablet (50 mg total) by mouth every 12 (twelve) hours as needed.    Dispense:  180 tablet    Refill:  1     Follow-up: No follow-ups on file.  Scarlette Calico, MD

## 2017-11-30 ENCOUNTER — Encounter: Payer: Self-pay | Admitting: Internal Medicine

## 2017-11-30 NOTE — Patient Instructions (Signed)

## 2017-12-03 ENCOUNTER — Telehealth: Payer: Self-pay | Admitting: *Deleted

## 2017-12-03 NOTE — Telephone Encounter (Signed)
Tramadol PA initiated via CoverMyMeds.  KeyAudie Box  PA Case ID: [id number]  Ins ph #: [phone] Pt ID: [id number]

## 2017-12-12 ENCOUNTER — Other Ambulatory Visit: Payer: Self-pay | Admitting: Internal Medicine

## 2018-02-18 ENCOUNTER — Encounter: Payer: Self-pay | Admitting: Internal Medicine

## 2018-02-18 ENCOUNTER — Other Ambulatory Visit (INDEPENDENT_AMBULATORY_CARE_PROVIDER_SITE_OTHER): Payer: Medicare Other

## 2018-02-18 ENCOUNTER — Ambulatory Visit: Payer: Medicare Other | Admitting: Internal Medicine

## 2018-02-18 VITALS — BP 170/90 | HR 68 | Temp 98.1°F | Resp 16 | Ht 72.0 in | Wt 182.5 lb

## 2018-02-18 DIAGNOSIS — D539 Nutritional anemia, unspecified: Secondary | ICD-10-CM

## 2018-02-18 DIAGNOSIS — Z0000 Encounter for general adult medical examination without abnormal findings: Secondary | ICD-10-CM

## 2018-02-18 DIAGNOSIS — E118 Type 2 diabetes mellitus with unspecified complications: Secondary | ICD-10-CM | POA: Diagnosis not present

## 2018-02-18 DIAGNOSIS — Z23 Encounter for immunization: Secondary | ICD-10-CM

## 2018-02-18 DIAGNOSIS — Z0001 Encounter for general adult medical examination with abnormal findings: Secondary | ICD-10-CM

## 2018-02-18 DIAGNOSIS — I129 Hypertensive chronic kidney disease with stage 1 through stage 4 chronic kidney disease, or unspecified chronic kidney disease: Secondary | ICD-10-CM | POA: Diagnosis not present

## 2018-02-18 LAB — BASIC METABOLIC PANEL
BUN: 30 mg/dL — ABNORMAL HIGH (ref 6–23)
CO2: 27 meq/L (ref 19–32)
Calcium: 9.8 mg/dL (ref 8.4–10.5)
Chloride: 103 mEq/L (ref 96–112)
Creatinine, Ser: 2.87 mg/dL — ABNORMAL HIGH (ref 0.40–1.50)
GFR: 26.92 mL/min — AB (ref >60.00)
GLUCOSE: 109 mg/dL — AB (ref 70–99)
POTASSIUM: 4.4 meq/L (ref 3.5–5.1)
Sodium: 139 mEq/L (ref 135–145)

## 2018-02-18 LAB — IBC PANEL
IRON: 116 ug/dL (ref 42–165)
SATURATION RATIOS: 38.2 % (ref 20.0–50.0)
Transferrin: 217 mg/dL (ref 212.0–360.0)

## 2018-02-18 LAB — CBC WITH DIFFERENTIAL/PLATELET
BASOS PCT: 0.3 % (ref 0.0–3.0)
Basophils Absolute: 0 10*3/uL (ref 0.0–0.1)
EOS ABS: 0.2 10*3/uL (ref 0.0–0.7)
EOS PCT: 2.2 % (ref 0.0–5.0)
HCT: 41.8 % (ref 39.0–52.0)
Hemoglobin: 14.2 g/dL (ref 13.0–17.0)
LYMPHS ABS: 0.8 10*3/uL (ref 0.7–4.0)
Lymphocytes Relative: 11.5 % — ABNORMAL LOW (ref 12.0–46.0)
MCHC: 34 g/dL (ref 30.0–36.0)
MCV: 92.3 fl (ref 78.0–100.0)
MONO ABS: 0.6 10*3/uL (ref 0.1–1.0)
Monocytes Relative: 8.5 % (ref 3.0–12.0)
NEUTROS PCT: 77.5 % — AB (ref 43.0–77.0)
Neutro Abs: 5.5 10*3/uL (ref 1.4–7.7)
Platelets: 134 10*3/uL — ABNORMAL LOW (ref 150.0–400.0)
RBC: 4.53 Mil/uL (ref 4.22–5.81)
RDW: 13.4 % (ref 11.5–15.5)
WBC: 7.2 10*3/uL (ref 4.0–10.5)

## 2018-02-18 LAB — FOLATE

## 2018-02-18 LAB — POCT GLYCOSYLATED HEMOGLOBIN (HGB A1C): HEMOGLOBIN A1C: 5.7 % — AB (ref 4.0–5.6)

## 2018-02-18 LAB — VITAMIN B12: VITAMIN B 12: 962 pg/mL — AB (ref 211–911)

## 2018-02-18 LAB — FERRITIN: Ferritin: 210.2 ng/mL (ref 22.0–322.0)

## 2018-02-18 MED ORDER — LISINOPRIL-HYDROCHLOROTHIAZIDE 20-25 MG PO TABS: 0.5000 | ORAL_TABLET | Freq: Every day | ORAL | 0 refills | Status: DC

## 2018-02-18 NOTE — Progress Notes (Signed)
Subjective:  Patient ID: Cory Leonard, male    DOB: [birth year]  Age: 84 y.o. MRN: [id number]  CC: Hypertension; Anemia; and Annual Exam   HPI [name] presents for a CPX.  He is concerned his blood pressure has not recently been well controlled.  It is difficult to get a history from him but it sounds like he recently ran out of the ACE inhibitor and thiazide diuretic.  He denies any recent episodes of headache, blurred vision, CP, DOE, palpitations, edema, or fatigue.  Past Medical History:  Diagnosis Date  . Chronic kidney disease   . Hyperlipidemia   . Hypertension    History reviewed. No pertinent surgical history.  reports that he has never smoked. He has never used smokeless tobacco. He reports that he does not drink alcohol or use drugs. Family history is unknown by patient. Allergies  Allergen Reactions  . Penicillins Swelling  . Amlodipine Swelling    Outpatient Medications Prior to Visit  Medication Sig Dispense Refill  . Calcium Carb-Cholecalciferol (CALCIUM-VITAMIN D) 500-200 MG-UNIT tablet Take 1 tablet by mouth daily.    . cyanocobalamin 500 MCG tablet Take 500 mcg by mouth daily.    . simvastatin (ZOCOR) 10 MG tablet TAKE 1 TABLET(10 MG) BY MOUTH DAILY 90 tablet 1  . timolol (BETIMOL) 0.5 % ophthalmic solution 1 drop 2 (two) times daily.    . timolol (TIMOPTIC) 0.5 % ophthalmic solution INT 1 GTT IN OU QAM  10  . traMADol (ULTRAM) 50 MG tablet Take 1 tablet (50 mg total) by mouth every 12 (twelve) hours as needed. 180 tablet 1  . lisinopril-hydrochlorothiazide (PRINZIDE,ZESTORETIC) 20-25 MG tablet Take 0.5 tablets by mouth daily. 90 tablet 1   No facility-administered medications prior to visit.     ROS Review of Systems  Constitutional: Negative.  Negative for appetite change, diaphoresis, fatigue and unexpected weight change.  HENT: Negative.   Eyes: Negative for visual disturbance.  Respiratory: Negative for cough, chest tightness, shortness of  breath and wheezing.   Cardiovascular: Negative for chest pain, palpitations and leg swelling.  Gastrointestinal: Negative for abdominal pain, diarrhea and nausea.  Endocrine: Negative.   Genitourinary: Negative.  Negative for difficulty urinating.  Musculoskeletal: Negative.  Negative for myalgias.  Skin: Negative.   Neurological: Negative.  Negative for dizziness, weakness and light-headedness.  Hematological: Negative for adenopathy. Does not bruise/bleed easily.  Psychiatric/Behavioral: Negative.     Objective:  BP (!) 170/90 (BP Location: Left Arm, Patient Position: Sitting, Cuff Size: Normal)   Pulse 68   Temp 98.1 F (36.7 C) (Oral)   Resp 16   Ht 6' (1.829 m)   Wt 182 lb 8 oz (82.8 kg)   SpO2 98%   BMI 24.75 kg/m   BP Readings from Last 3 Encounters:  02/18/18 (!) 170/90  11/28/17 (!) 142/80  06/08/17 130/78    Wt Readings from Last 3 Encounters:  02/18/18 182 lb 8 oz (82.8 kg)  11/28/17 180 lb (81.6 kg)  06/08/17 175 lb (79.4 kg)    Physical Exam  Constitutional: He is oriented to person, place, and time. No distress.  HENT:  Mouth/Throat: Oropharynx is clear and moist. No oropharyngeal exudate.  Eyes: Conjunctivae are normal. No scleral icterus.  Neck: Normal range of motion. Neck supple. No JVD present. No thyromegaly present.  Cardiovascular: Normal rate, regular rhythm and normal heart sounds.  No murmur heard. Pulmonary/Chest: Effort normal and breath sounds normal. No respiratory distress. He has no wheezes. He  has no rales.  Abdominal: Soft. Bowel sounds are normal. He exhibits no mass. There is no hepatosplenomegaly. There is no tenderness.  Genitourinary:  Genitourinary Comments: GU and rectal exams were deferred at his request  Musculoskeletal: Normal range of motion. He exhibits no edema, tenderness or deformity.  Lymphadenopathy:    He has no cervical adenopathy.  Neurological: He is alert and oriented to person, place, and time.  Skin: Skin is  warm and dry. No rash noted. He is not diaphoretic.  Vitals reviewed.   Lab Results  Component Value Date   WBC 7.2 02/18/2018   HGB 14.2 02/18/2018   HCT 41.8 02/18/2018   PLT 134.0 (L) 02/18/2018   GLUCOSE 109 (H) 02/18/2018   CHOL 157 11/28/2017   TRIG 94.0 11/28/2017   HDL 46.60 11/28/2017   LDLCALC 91 11/28/2017   ALT 12 11/28/2017   AST 13 11/28/2017   NA 139 02/18/2018   K 4.4 02/18/2018   CL 103 02/18/2018   CREATININE 2.87 (H) 02/18/2018   BUN 30 (H) 02/18/2018   CO2 27 02/18/2018   TSH 2.13 01/09/2017   HGBA1C 5.7 (A) 02/18/2018   MICROALBUR 0.7 01/09/2017    No results found.  Assessment & Plan:   Azarias was seen today for hypertension, anemia and annual exam.  Diagnoses and all orders for this visit:  Type 2 diabetes mellitus with complication, without long-term current use of insulin ([place])- His A1c is at 5.7%.  His blood sugars are very well controlled.  Medical therapy is not indicated. 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  I will screen him for vitamin deficiencies. -     CBC with Differential/Platelet; Future -     IBC panel; Future -     Vitamin B12; Future -     Folate; Future -     Ferritin; Future -     Vitamin B1; Future  Hypertension, renal disease- His blood pressure is not adequately well controlled.  He is with his wife today.  I spoke to them about improving his compliance with lisinopril/hydrochlorothiazide.  Also, his creatinine clearance is down to 19.  He tells me he has an appointment next week with nephrology. -     Basic metabolic panel; Future -     lisinopril-hydrochlorothiazide (PRINZIDE,ZESTORETIC) 20-25 MG tablet; Take 0.5 tablets by mouth daily.  Routine general medical examination at a health care facility   I am having Cory Leonard maintain his  calcium-vitamin D, vitamin B-12, timolol, traMADol, simvastatin, timolol, and lisinopril-hydrochlorothiazide.  Meds ordered this encounter  Medications  . lisinopril-hydrochlorothiazide (PRINZIDE,ZESTORETIC) 20-25 MG tablet    Sig: Take 0.5 tablets by mouth daily.    Dispense:  90 tablet    Refill:  0   See AVS for instructions about healthy living and anticipatory guidance.  Follow-up: Return in about 6 months (around 08/19/2018).  Scarlette Calico, MD

## 2018-02-18 NOTE — Patient Instructions (Signed)

## 2018-02-21 DIAGNOSIS — Z0000 Encounter for general adult medical examination without abnormal findings: Secondary | ICD-10-CM | POA: Insufficient documentation

## 2018-02-21 NOTE — Assessment & Plan Note (Signed)

## 2018-02-24 ENCOUNTER — Encounter: Payer: Self-pay | Admitting: Internal Medicine

## 2018-02-24 LAB — VITAMIN B1: Vitamin B1 (Thiamine): 16 nmol/L (ref 8–30)

## 2018-06-08 ENCOUNTER — Other Ambulatory Visit: Payer: Self-pay | Admitting: Internal Medicine

## 2018-09-12 ENCOUNTER — Other Ambulatory Visit: Payer: Self-pay | Admitting: Internal Medicine

## 2018-09-12 DIAGNOSIS — I129 Hypertensive chronic kidney disease with stage 1 through stage 4 chronic kidney disease, or unspecified chronic kidney disease: Secondary | ICD-10-CM

## 2018-10-09 LAB — HM DIABETES EYE EXAM

## 2018-11-26 ENCOUNTER — Other Ambulatory Visit (INDEPENDENT_AMBULATORY_CARE_PROVIDER_SITE_OTHER): Payer: Medicare Other

## 2018-11-26 ENCOUNTER — Other Ambulatory Visit: Payer: Self-pay

## 2018-11-26 ENCOUNTER — Encounter: Payer: Self-pay | Admitting: Internal Medicine

## 2018-11-26 ENCOUNTER — Ambulatory Visit (INDEPENDENT_AMBULATORY_CARE_PROVIDER_SITE_OTHER): Payer: Medicare Other | Admitting: Internal Medicine

## 2018-11-26 VITALS — BP 136/64 | HR 67 | Temp 99.0°F | Ht 72.0 in | Wt 179.0 lb

## 2018-11-26 DIAGNOSIS — I129 Hypertensive chronic kidney disease with stage 1 through stage 4 chronic kidney disease, or unspecified chronic kidney disease: Secondary | ICD-10-CM

## 2018-11-26 DIAGNOSIS — E785 Hyperlipidemia, unspecified: Secondary | ICD-10-CM

## 2018-11-26 DIAGNOSIS — D696 Thrombocytopenia, unspecified: Secondary | ICD-10-CM | POA: Insufficient documentation

## 2018-11-26 DIAGNOSIS — Z23 Encounter for immunization: Secondary | ICD-10-CM

## 2018-11-26 DIAGNOSIS — E118 Type 2 diabetes mellitus with unspecified complications: Secondary | ICD-10-CM

## 2018-11-26 LAB — BASIC METABOLIC PANEL
BUN: 30 mg/dL — ABNORMAL HIGH (ref 6–23)
CO2: 27 mEq/L (ref 19–32)
Calcium: 9.6 mg/dL (ref 8.4–10.5)
Chloride: 104 mEq/L (ref 96–112)
Creatinine, Ser: 2.88 mg/dL — ABNORMAL HIGH (ref 0.40–1.50)
GFR: 25.18 mL/min — ABNORMAL LOW (ref >60.00)
Glucose, Bld: 101 mg/dL — ABNORMAL HIGH (ref 70–99)
Potassium: 4.8 mEq/L (ref 3.5–5.1)
Sodium: 139 mEq/L (ref 135–145)

## 2018-11-26 LAB — LIPID PANEL
Cholesterol: 147 mg/dL (ref 0–200)
HDL: 42.9 mg/dL (ref >39.00)
LDL Cholesterol: 84 mg/dL (ref 0–99)
NonHDL: 104.08
Total CHOL/HDL Ratio: 3
Triglycerides: 101 mg/dL (ref 0.0–149.0)
VLDL: 20.2 mg/dL (ref 0.0–40.0)

## 2018-11-26 LAB — URINALYSIS, ROUTINE W REFLEX MICROSCOPIC
Bilirubin Urine: NEGATIVE
Ketones, ur: NEGATIVE
Leukocytes,Ua: NEGATIVE
Nitrite: NEGATIVE
Specific Gravity, Urine: 1.01 (ref 1.000–1.030)
Total Protein, Urine: NEGATIVE
Urine Glucose: NEGATIVE
Urobilinogen, UA: 0.2 (ref 0.0–1.0)
pH: 6 (ref 5.0–8.0)

## 2018-11-26 LAB — CBC WITH DIFFERENTIAL/PLATELET
Basophils Absolute: 0 10*3/uL (ref 0.0–0.1)
Basophils Relative: 0.4 % (ref 0.0–3.0)
Eosinophils Absolute: 0.1 10*3/uL (ref 0.0–0.7)
Eosinophils Relative: 2.3 % (ref 0.0–5.0)
HCT: 39.6 % (ref 39.0–52.0)
Hemoglobin: 13.2 g/dL (ref 13.0–17.0)
Lymphocytes Relative: 14 % (ref 12.0–46.0)
Lymphs Abs: 0.9 10*3/uL (ref 0.7–4.0)
MCHC: 33.3 g/dL (ref 30.0–36.0)
MCV: 92.6 fl (ref 78.0–100.0)
Monocytes Absolute: 0.6 10*3/uL (ref 0.1–1.0)
Monocytes Relative: 10.3 % (ref 3.0–12.0)
Neutro Abs: 4.5 10*3/uL (ref 1.4–7.7)
Neutrophils Relative %: 73 % (ref 43.0–77.0)
Platelets: 143 10*3/uL — ABNORMAL LOW (ref 150.0–400.0)
RBC: 4.27 Mil/uL (ref 4.22–5.81)
RDW: 14.2 % (ref 11.5–15.5)
WBC: 6.2 10*3/uL (ref 4.0–10.5)

## 2018-11-26 LAB — VITAMIN B12: Vitamin B-12: 809 pg/mL (ref 211–911)

## 2018-11-26 LAB — HEMOGLOBIN A1C: Hgb A1c MFr Bld: 6.4 % (ref 4.6–6.5)

## 2018-11-26 LAB — TSH: TSH: 2.38 u[IU]/mL (ref 0.35–4.50)

## 2018-11-26 LAB — FOLATE: Folate: 21.8 ng/mL (ref >5.9)

## 2018-11-26 NOTE — Progress Notes (Signed)
Subjective:  Patient ID: Cory Leonard, male    DOB: Nov 05, 1928  Age: 85 y.o. MRN: [id number]  CC: Hyperlipidemia, Hypertension, and Diabetes   HPI Cory Leonard presents for f/up - He has been doing well lately.  He offers no complaints today.  He tells me his blood pressure and blood sugars have been well controlled.  Outpatient Medications Prior to Visit  Medication Sig Dispense Refill  . Calcium Carb-Cholecalciferol (CALCIUM-VITAMIN D) 500-200 MG-UNIT tablet Take 1 tablet by mouth daily.    . cyanocobalamin 500 MCG tablet Take 500 mcg by mouth daily.    Marland Kitchen lisinopril-hydrochlorothiazide (ZESTORETIC) 20-25 MG tablet TAKE 1/2 TABLET BY MOUTH DAILY 90 tablet 0  . simvastatin (ZOCOR) 10 MG tablet TAKE 1 TABLET(10 MG) BY MOUTH DAILY 90 tablet 1  . timolol (BETIMOL) 0.5 % ophthalmic solution 1 drop 2 (two) times daily.    . traMADol (ULTRAM) 50 MG tablet Take 1 tablet (50 mg total) by mouth every 12 (twelve) hours as needed. 180 tablet 1  . timolol (TIMOPTIC) 0.5 % ophthalmic solution INT 1 GTT IN OU QAM  10   No facility-administered medications prior to visit.     ROS Review of Systems  Constitutional: Negative for diaphoresis and fatigue.  HENT: Negative.   Eyes: Negative for visual disturbance.  Respiratory: Negative for cough, chest tightness, shortness of breath and wheezing.   Cardiovascular: Negative for chest pain, palpitations and leg swelling.  Gastrointestinal: Negative for abdominal pain, constipation, diarrhea, nausea and vomiting.  Endocrine: Negative.  Negative for polydipsia and polyuria.  Genitourinary: Negative.  Negative for difficulty urinating.  Musculoskeletal: Negative.  Negative for arthralgias and myalgias.  Skin: Negative.   Neurological: Negative.  Negative for dizziness, weakness, light-headedness and headaches.  Hematological: Negative for adenopathy. Does not bruise/bleed easily.  Psychiatric/Behavioral: Negative.     Objective:  BP 136/64 (BP  Location: Left Arm, Patient Position: Sitting, Cuff Size: Normal)   Pulse 67   Temp 99 F (37.2 C) (Oral)   Ht 6' (1.829 m)   Wt 179 lb (81.2 kg)   SpO2 98%   BMI 24.28 kg/m   BP Readings from Last 3 Encounters:  11/26/18 136/64  02/18/18 (!) 170/90  11/28/17 (!) 142/80    Wt Readings from Last 3 Encounters:  11/26/18 179 lb (81.2 kg)  02/18/18 182 lb 8 oz (82.8 kg)  11/28/17 180 lb (81.6 kg)    Physical Exam Vitals signs reviewed.  Constitutional:      Appearance: Normal appearance.  HENT:     Nose: Nose normal.     Mouth/Throat:     Mouth: Mucous membranes are moist.  Eyes:     Conjunctiva/sclera: Conjunctivae normal.  Neck:     Musculoskeletal: Normal range of motion. No neck rigidity or muscular tenderness.  Cardiovascular:     Rate and Rhythm: Normal rate and regular rhythm.     Heart sounds: No murmur.  Pulmonary:     Effort: Pulmonary effort is normal.     Breath sounds: No stridor. No wheezing, rhonchi or rales.  Abdominal:     General: Abdomen is flat. Bowel sounds are normal. There is no distension.     Palpations: There is no hepatomegaly, splenomegaly or mass.     Tenderness: There is no abdominal tenderness. There is no guarding.  Musculoskeletal: Normal range of motion.     Right lower leg: No edema.     Left lower leg: No edema.  Lymphadenopathy:  Cervical: No cervical adenopathy.  Skin:    General: Skin is warm and dry.     Coloration: Skin is not pale.  Neurological:     General: No focal deficit present.     Mental Status: He is alert.     Lab Results  Component Value Date   WBC 6.2 11/26/2018   HGB 13.2 11/26/2018   HCT 39.6 11/26/2018   PLT 143.0 (L) 11/26/2018   GLUCOSE 101 (H) 11/26/2018   CHOL 147 11/26/2018   TRIG 101.0 11/26/2018   HDL 42.90 11/26/2018   LDLCALC 84 11/26/2018   ALT 12 11/28/2017   AST 13 11/28/2017   NA 139 11/26/2018   K 4.8 11/26/2018   CL 104 11/26/2018   CREATININE 2.88 (H) 11/26/2018   BUN 30  (H) 11/26/2018   CO2 27 11/26/2018   TSH 2.38 11/26/2018   HGBA1C 6.4 11/26/2018   MICROALBUR 0.7 01/09/2017    No results found.  Assessment & Plan:   Cory Leonard was seen today for hyperlipidemia, hypertension and diabetes.  Diagnoses and all orders for this visit:  Need for influenza vaccination -     Flu Vaccine QUAD High Dose(Fluad)  Hyperlipidemia LDL goal <100- He has achieved his LDL goal and is doing well on the statin. -     Lipid panel; Future -     TSH; Future  Type 2 diabetes mellitus with complication, without long-term current use of insulin (Cory Leonard)- His blood sugars are adequately well controlled. -     Basic metabolic panel; Future -     Hemoglobin A1c; Future  Thrombocytopenia (Cory Leonard)- He continues to have a low platelet count but no history of bleeding or bruising.  B12 and folate levels are normal.  This is likely ITP. -     CBC with Differential/Platelet; Future -     Vitamin B12; Future -     Folate; Future  Hypertension, renal disease- His blood pressure is adequately well controlled.  Renal function is stable.  Urinalysis is normal.  He will avoid nephrotoxic agents.  We will continue to maintain control of his blood pressure. -     Urinalysis, Routine w reflex microscopic; Future  Need for pneumococcal vaccination -     Pneumococcal conjugate vaccine 13-valent   I am having Cory Leonard maintain his calcium-vitamin D, vitamin B-12, timolol, traMADol, simvastatin, and lisinopril-hydrochlorothiazide.  No orders of the defined types were placed in this encounter.    Follow-up: Return in about 6 months (around 05/26/2019).  Scarlette Calico, MD

## 2018-11-26 NOTE — Patient Instructions (Signed)

## 2018-12-08 ENCOUNTER — Other Ambulatory Visit: Payer: Self-pay | Admitting: Internal Medicine

## 2018-12-08 DIAGNOSIS — E785 Hyperlipidemia, unspecified: Secondary | ICD-10-CM

## 2018-12-08 MED ORDER — SIMVASTATIN 10 MG PO TABS: ORAL_TABLET | ORAL | 1 refills | Status: DC

## 2019-03-02 ENCOUNTER — Other Ambulatory Visit: Payer: Self-pay | Admitting: Internal Medicine

## 2019-03-02 DIAGNOSIS — I129 Hypertensive chronic kidney disease with stage 1 through stage 4 chronic kidney disease, or unspecified chronic kidney disease: Secondary | ICD-10-CM

## 2019-03-02 MED ORDER — LISINOPRIL-HYDROCHLOROTHIAZIDE 20-25 MG PO TABS: 0.5000 | ORAL_TABLET | Freq: Every day | ORAL | 1 refills | Status: DC

## 2019-05-07 ENCOUNTER — Ambulatory Visit: Payer: Medicare Other

## 2019-05-14 ENCOUNTER — Ambulatory Visit: Payer: Self-pay

## 2019-05-18 ENCOUNTER — Ambulatory Visit: Payer: Medicare Other | Attending: Family

## 2019-05-18 DIAGNOSIS — Z23 Encounter for immunization: Secondary | ICD-10-CM | POA: Insufficient documentation

## 2019-05-18 NOTE — Progress Notes (Signed)
   Covid-19 Vaccination Clinic  Name:  Cory Leonard    MRN: [id number] DOB: 1928/11/06  05/18/2019  Mr. [name] was observed post Covid-19 immunization for 15 minutes without incidence. He was provided with Vaccine Information Sheet and instruction to access the V-Safe system.   [name] was instructed to call 911 with any severe reactions post vaccine: Marland Kitchen Difficulty breathing  . Swelling of your face and throat  . A fast heartbeat  . A bad rash all over your body  . Dizziness and weakness    Immunizations Administered    Name Date Dose VIS Date Route   Moderna COVID-19 Vaccine 05/18/2019  3:59 PM 0.5 mL 02/24/2019 Intramuscular   Manufacturer: Moderna   Lot: [id number]   [id number]

## 2019-05-31 ENCOUNTER — Other Ambulatory Visit: Payer: Self-pay | Admitting: Internal Medicine

## 2019-05-31 DIAGNOSIS — E785 Hyperlipidemia, unspecified: Secondary | ICD-10-CM

## 2019-06-30 ENCOUNTER — Ambulatory Visit: Payer: Medicare Other | Attending: Family

## 2019-06-30 DIAGNOSIS — Z23 Encounter for immunization: Secondary | ICD-10-CM

## 2019-06-30 NOTE — Progress Notes (Signed)
   Covid-19 Vaccination Clinic  Name:  Cory Leonard    MRN: [id number] DOB: 13-Jun-1928  06/30/2019  Mr. Cory Leonard was observed post Covid-19 immunization for 15 minutes without incident. He was provided with Vaccine Information Sheet and instruction to access the V-Safe system.   Mr. Cory Leonard was instructed to call 911 with any severe reactions post vaccine: Marland Kitchen Difficulty breathing  . Swelling of face and throat  . A fast heartbeat  . A bad rash all over body  . Dizziness and weakness   Immunizations Administered    Name Date Dose VIS Date Route   Moderna COVID-19 Vaccine 06/30/2019  3:25 PM 0.5 mL 02/24/2019 Intramuscular   Manufacturer: Moderna   Lot: [id number]   [place]: [id number]

## 2019-07-28 ENCOUNTER — Ambulatory Visit: Payer: Medicare Other | Admitting: Internal Medicine

## 2019-07-30 ENCOUNTER — Other Ambulatory Visit: Payer: Self-pay

## 2019-07-30 ENCOUNTER — Ambulatory Visit: Payer: Medicare Other | Admitting: Internal Medicine

## 2019-07-30 ENCOUNTER — Encounter: Payer: Self-pay | Admitting: Internal Medicine

## 2019-07-30 VITALS — BP 144/76 | HR 72 | Temp 98.8°F | Resp 16 | Ht 72.0 in | Wt 175.2 lb

## 2019-07-30 DIAGNOSIS — D696 Thrombocytopenia, unspecified: Secondary | ICD-10-CM | POA: Diagnosis not present

## 2019-07-30 DIAGNOSIS — I129 Hypertensive chronic kidney disease with stage 1 through stage 4 chronic kidney disease, or unspecified chronic kidney disease: Secondary | ICD-10-CM | POA: Diagnosis not present

## 2019-07-30 DIAGNOSIS — N289 Disorder of kidney and ureter, unspecified: Secondary | ICD-10-CM

## 2019-07-30 DIAGNOSIS — E785 Hyperlipidemia, unspecified: Secondary | ICD-10-CM

## 2019-07-30 DIAGNOSIS — Z0000 Encounter for general adult medical examination without abnormal findings: Secondary | ICD-10-CM

## 2019-07-30 DIAGNOSIS — E118 Type 2 diabetes mellitus with unspecified complications: Secondary | ICD-10-CM

## 2019-07-30 LAB — CBC WITH DIFFERENTIAL/PLATELET
Basophils Absolute: 0 10*3/uL (ref 0.0–0.1)
Basophils Relative: 0.3 % (ref 0.0–3.0)
Eosinophils Absolute: 0.1 10*3/uL (ref 0.0–0.7)
Eosinophils Relative: 1.5 % (ref 0.0–5.0)
HCT: 37.5 % — ABNORMAL LOW (ref 39.0–52.0)
Hemoglobin: 12.7 g/dL — ABNORMAL LOW (ref 13.0–17.0)
Lymphocytes Relative: 11.6 % — ABNORMAL LOW (ref 12.0–46.0)
Lymphs Abs: 0.8 10*3/uL (ref 0.7–4.0)
MCHC: 33.8 g/dL (ref 30.0–36.0)
MCV: 93.8 fl (ref 78.0–100.0)
Monocytes Absolute: 0.7 10*3/uL (ref 0.1–1.0)
Monocytes Relative: 10.5 % (ref 3.0–12.0)
Neutro Abs: 5.3 10*3/uL (ref 1.4–7.7)
Neutrophils Relative %: 76.1 % (ref 43.0–77.0)
Platelets: 125 10*3/uL — ABNORMAL LOW (ref 150.0–400.0)
RBC: 4 Mil/uL — ABNORMAL LOW (ref 4.22–5.81)
RDW: 13.4 % (ref 11.5–15.5)
WBC: 7 10*3/uL (ref 4.0–10.5)

## 2019-07-30 LAB — BASIC METABOLIC PANEL
BUN: 39 mg/dL — ABNORMAL HIGH (ref 6–23)
CO2: 31 mEq/L (ref 19–32)
Calcium: 9.2 mg/dL (ref 8.4–10.5)
Chloride: 103 mEq/L (ref 96–112)
Creatinine, Ser: 3.21 mg/dL — ABNORMAL HIGH (ref 0.40–1.50)
GFR: 22.18 mL/min — ABNORMAL LOW (ref >60.00)
Glucose, Bld: 94 mg/dL (ref 70–99)
Potassium: 4.8 mEq/L (ref 3.5–5.1)
Sodium: 136 mEq/L (ref 135–145)

## 2019-07-30 LAB — HEMOGLOBIN A1C: Hgb A1c MFr Bld: 6.3 % (ref 4.6–6.5)

## 2019-07-30 LAB — URINALYSIS, ROUTINE W REFLEX MICROSCOPIC
Bilirubin Urine: NEGATIVE
Ketones, ur: NEGATIVE
Leukocytes,Ua: NEGATIVE
Nitrite: NEGATIVE
Specific Gravity, Urine: 1.02 (ref 1.000–1.030)
Total Protein, Urine: NEGATIVE
Urine Glucose: NEGATIVE
Urobilinogen, UA: 0.2 (ref 0.0–1.0)
pH: 6.5 (ref 5.0–8.0)

## 2019-07-30 LAB — MICROALBUMIN / CREATININE URINE RATIO
Creatinine,U: 108.2 mg/dL
Microalb Creat Ratio: 0.6 mg/g (ref 0.0–30.0)
Microalb, Ur: <0.7 mg/dL (ref 0.0–1.9)

## 2019-07-30 NOTE — Progress Notes (Signed)
Subjective:  Patient ID: Cory Leonard, male    DOB: 1928-05-04  Age: 86 y.o. MRN: [id number]  CC: Annual Exam and Hypertension  This visit occurred during the SARS-CoV-2 public health emergency.  Safety protocols were in place, including screening questions prior to the visit, additional usage of staff PPE, and extensive cleaning of exam room while observing appropriate contact time as indicated for disinfecting solutions.    HPI Cory Leonard presents for a CPX.  He tells me that over the last few months he developed lower extremity edema.  He tells me that he saw his nephrologist about a month ago and a loop diuretic was added to his current regimen.  He tells me the lower extremity edema is getting better.  He denies any recent episodes of chest pain, shortness of breath, palpitations, or edema.  Outpatient Medications Prior to Visit  Medication Sig Dispense Refill  . Calcium Carb-Cholecalciferol (CALCIUM-VITAMIN D) 500-200 MG-UNIT tablet Take 1 tablet by mouth daily.    . cyanocobalamin 500 MCG tablet Take 500 mcg by mouth daily.    . furosemide (LASIX) 20 MG tablet Take 20 mg by mouth every morning.    Marland Kitchen ketorolac (ACULAR) 0.5 % ophthalmic solution 1 drop daily.    Marland Kitchen lisinopril-hydrochlorothiazide (ZESTORETIC) 20-25 MG tablet Take 0.5 tablets by mouth daily. 45 tablet 1  . simvastatin (ZOCOR) 10 MG tablet TAKE 1 TABLET(10 MG) BY MOUTH DAILY 90 tablet 1  . timolol (BETIMOL) 0.5 % ophthalmic solution 1 drop 2 (two) times daily.    . traMADol (ULTRAM) 50 MG tablet Take 1 tablet (50 mg total) by mouth every 12 (twelve) hours as needed. 180 tablet 1   No facility-administered medications prior to visit.    ROS Review of Systems  Constitutional: Negative for appetite change, diaphoresis, fatigue and unexpected weight change.  Eyes: Negative for visual disturbance.  Respiratory: Negative for cough, chest tightness, shortness of breath and wheezing.   Cardiovascular: Positive for  leg swelling. Negative for chest pain and palpitations.  Gastrointestinal: Negative for abdominal pain, constipation, nausea and vomiting.  Endocrine: Negative.   Genitourinary: Negative.  Negative for difficulty urinating.  Musculoskeletal: Negative.  Negative for arthralgias and myalgias.  Skin: Negative.   Neurological: Negative.  Negative for dizziness, weakness, light-headedness and headaches.  Hematological: Negative for adenopathy. Does not bruise/bleed easily.  Psychiatric/Behavioral: Negative.     Objective:  BP (!) 144/76 (BP Location: Left Arm, Patient Position: Sitting, Cuff Size: Large)   Pulse 72   Temp 98.8 F (37.1 C) (Oral)   Resp 16   Ht 6' (1.829 m)   Wt 175 lb 4 oz (79.5 kg)   SpO2 98%   BMI 23.77 kg/m   BP Readings from Last 3 Encounters:  07/30/19 (!) 144/76  11/26/18 136/64  02/18/18 (!) 170/90    Wt Readings from Last 3 Encounters:  07/30/19 175 lb 4 oz (79.5 kg)  11/26/18 179 lb (81.2 kg)  02/18/18 182 lb 8 oz (82.8 kg)    Physical Exam Vitals reviewed.  Constitutional:      Appearance: Normal appearance.  HENT:     Nose: Nose normal.     Mouth/Throat:     Mouth: Mucous membranes are moist.  Eyes:     General: No scleral icterus.    Conjunctiva/sclera: Conjunctivae normal.  Cardiovascular:     Rate and Rhythm: Normal rate and regular rhythm.     Heart sounds: No murmur. No friction rub. No gallop.  Pulmonary:     Effort: Pulmonary effort is normal.     Breath sounds: No stridor. No wheezing, rhonchi or rales.  Abdominal:     General: Abdomen is flat. Bowel sounds are normal. There is no distension.     Palpations: Abdomen is soft. There is no hepatomegaly, splenomegaly or mass.     Tenderness: There is no guarding.  Musculoskeletal:     Cervical back: Neck supple.     Right lower leg: 1+ Pitting Edema present.     Left lower leg: 1+ Pitting Edema present.  Lymphadenopathy:     Cervical: No cervical adenopathy.  Skin:    General:  Skin is warm and dry.     Coloration: Skin is not pale.  Neurological:     General: No focal deficit present.     Mental Status: He is alert.  Psychiatric:        Mood and Affect: Mood normal.     Lab Results  Component Value Date   WBC 7.0 07/30/2019   HGB 12.7 (L) 07/30/2019   HCT 37.5 (L) 07/30/2019   PLT 125.0 (L) 07/30/2019   GLUCOSE 94 07/30/2019   CHOL 147 11/26/2018   TRIG 101.0 11/26/2018   HDL 42.90 11/26/2018   LDLCALC 84 11/26/2018   ALT 12 11/28/2017   AST 13 11/28/2017   NA 136 07/30/2019   K 4.8 07/30/2019   CL 103 07/30/2019   CREATININE 3.21 (H) 07/30/2019   BUN 39 (H) 07/30/2019   CO2 31 07/30/2019   TSH 2.38 11/26/2018   HGBA1C 6.3 07/30/2019   MICROALBUR <0.7 07/30/2019    No results found.  Assessment & Plan:   Cory Leonard was seen today for annual exam and hypertension.  Diagnoses and all orders for this visit:  Thrombocytopenia ([place])- His platelet count remains low but there is no history of bleeding or bruising.  In the last year his B12 and folate levels were normal.  This is likely ITP.  We will continue to follow. -     CBC with Differential/Platelet; Future -     CBC with Differential/Platelet  Renal insufficiency, mild- His renal function is stable. -     Basic metabolic panel; Future -     Basic metabolic panel  Type 2 diabetes mellitus with complication, without long-term current use of insulin ([place])- His blood sugar is adequately well controlled. -     Hemoglobin A1c; Future -     Microalbumin / creatinine urine ratio; Future -     HM Diabetes Foot Exam -     Microalbumin / creatinine urine ratio -     Hemoglobin A1c  Hypertension, renal disease- His blood pressure is adequately well controlled. -     Basic metabolic panel; Future -     Urinalysis, Routine w reflex microscopic; Future -     Urinalysis, Routine w reflex microscopic -     Basic metabolic panel  Routine general medical examination at a health care facility- Exam  completed, labs reviewed, vaccines reviewed and updated, no cancer screenings are indicated, patient education was given.  Hyperlipidemia LDL goal <100- He has achieved his LDL goal and is doing well on the statin.   I am having Cory Leonard maintain his calcium-vitamin D, vitamin B-12, timolol, traMADol, lisinopril-hydrochlorothiazide, simvastatin, furosemide, and ketorolac.  No orders of the defined types were placed in this encounter.    Follow-up: Return in about 6 months (around 01/30/2020).  Scarlette Calico, MD

## 2019-07-30 NOTE — Patient Instructions (Signed)

## 2019-07-31 ENCOUNTER — Encounter: Payer: Self-pay | Admitting: Internal Medicine

## 2019-08-30 ENCOUNTER — Other Ambulatory Visit: Payer: Self-pay | Admitting: Internal Medicine

## 2019-08-30 DIAGNOSIS — I129 Hypertensive chronic kidney disease with stage 1 through stage 4 chronic kidney disease, or unspecified chronic kidney disease: Secondary | ICD-10-CM

## 2019-11-02 LAB — HM DIABETES EYE EXAM

## 2019-11-12 ENCOUNTER — Encounter: Payer: Self-pay | Admitting: Internal Medicine

## 2019-11-30 ENCOUNTER — Other Ambulatory Visit: Payer: Self-pay | Admitting: Internal Medicine

## 2019-11-30 DIAGNOSIS — E785 Hyperlipidemia, unspecified: Secondary | ICD-10-CM

## 2020-01-25 ENCOUNTER — Telehealth: Payer: Self-pay | Admitting: Internal Medicine

## 2020-01-25 NOTE — Telephone Encounter (Signed)
LVM for pt to rtn my call to schedule AWV with [facility]. Please schedule this appt if pt calls the office.  Thanks,

## 2020-02-02 ENCOUNTER — Encounter: Payer: Self-pay | Admitting: Internal Medicine

## 2020-02-02 ENCOUNTER — Ambulatory Visit: Payer: Medicare Other | Admitting: Internal Medicine

## 2020-02-02 ENCOUNTER — Other Ambulatory Visit: Payer: Self-pay

## 2020-02-02 VITALS — BP 146/86 | HR 77 | Temp 98.3°F | Resp 16 | Ht 72.0 in | Wt 178.0 lb

## 2020-02-02 DIAGNOSIS — D696 Thrombocytopenia, unspecified: Secondary | ICD-10-CM | POA: Diagnosis not present

## 2020-02-02 DIAGNOSIS — I129 Hypertensive chronic kidney disease with stage 1 through stage 4 chronic kidney disease, or unspecified chronic kidney disease: Secondary | ICD-10-CM | POA: Diagnosis not present

## 2020-02-02 DIAGNOSIS — E118 Type 2 diabetes mellitus with unspecified complications: Secondary | ICD-10-CM | POA: Diagnosis not present

## 2020-02-02 DIAGNOSIS — E785 Hyperlipidemia, unspecified: Secondary | ICD-10-CM | POA: Diagnosis not present

## 2020-02-02 DIAGNOSIS — N289 Disorder of kidney and ureter, unspecified: Secondary | ICD-10-CM

## 2020-02-02 DIAGNOSIS — Z23 Encounter for immunization: Secondary | ICD-10-CM | POA: Insufficient documentation

## 2020-02-02 LAB — CBC WITH DIFFERENTIAL/PLATELET
Basophils Absolute: 0 10*3/uL (ref 0.0–0.1)
Basophils Relative: 0.4 % (ref 0.0–3.0)
Eosinophils Absolute: 0.1 10*3/uL (ref 0.0–0.7)
Eosinophils Relative: 1.8 % (ref 0.0–5.0)
HCT: 38.4 % — ABNORMAL LOW (ref 39.0–52.0)
Hemoglobin: 12.8 g/dL — ABNORMAL LOW (ref 13.0–17.0)
Lymphocytes Relative: 14.8 % (ref 12.0–46.0)
Lymphs Abs: 0.8 10*3/uL (ref 0.7–4.0)
MCHC: 33.2 g/dL (ref 30.0–36.0)
MCV: 92.9 fl (ref 78.0–100.0)
Monocytes Absolute: 0.7 10*3/uL (ref 0.1–1.0)
Monocytes Relative: 12.3 % — ABNORMAL HIGH (ref 3.0–12.0)
Neutro Abs: 4 10*3/uL (ref 1.4–7.7)
Neutrophils Relative %: 70.7 % (ref 43.0–77.0)
Platelets: 130 10*3/uL — ABNORMAL LOW (ref 150.0–400.0)
RBC: 4.14 Mil/uL — ABNORMAL LOW (ref 4.22–5.81)
RDW: 13.8 % (ref 11.5–15.5)
WBC: 5.6 10*3/uL (ref 4.0–10.5)

## 2020-02-02 LAB — BASIC METABOLIC PANEL
BUN: 36 mg/dL — ABNORMAL HIGH (ref 6–23)
CO2: 30 mEq/L (ref 19–32)
Calcium: 9.5 mg/dL (ref 8.4–10.5)
Chloride: 103 mEq/L (ref 96–112)
Creatinine, Ser: 3.72 mg/dL — ABNORMAL HIGH (ref 0.40–1.50)
GFR: 13.83 mL/min — CL (ref >60.00)
Glucose, Bld: 99 mg/dL (ref 70–99)
Potassium: 5 mEq/L (ref 3.5–5.1)
Sodium: 139 mEq/L (ref 135–145)

## 2020-02-02 LAB — VITAMIN B12: Vitamin B-12: 705 pg/mL (ref 211–911)

## 2020-02-02 LAB — FOLATE: Folate: 15.5 ng/mL (ref >5.9)

## 2020-02-02 LAB — HEMOGLOBIN A1C: Hgb A1c MFr Bld: 6.5 % (ref 4.6–6.5)

## 2020-02-02 MED ORDER — SHINGRIX 50 MCG/0.5ML IM SUSR: 0.5000 mL | Freq: Once | INTRAMUSCULAR | 1 refills | Status: AC

## 2020-02-02 NOTE — Patient Instructions (Signed)
Anemia  Anemia is a condition in which you do not have enough red blood cells or hemoglobin. Hemoglobin is a substance in red blood cells that carries oxygen. When you do not have enough red blood cells or hemoglobin (are anemic), your body cannot get enough oxygen and your organs may not work properly. As a result, you may feel very tired or have other problems. What are the causes? Common causes of anemia include:  Excessive bleeding. Anemia can be caused by excessive bleeding inside or outside the body, including bleeding from the intestine or from periods in women.  Poor nutrition.  Long-lasting (chronic) kidney, thyroid, and liver disease.  Bone marrow disorders.  Cancer and treatments for cancer.  HIV (human immunodeficiency virus) and AIDS (acquired immunodeficiency syndrome).  Treatments for HIV and AIDS.  Spleen problems.  Blood disorders.  Infections, medicines, and autoimmune disorders that destroy red blood cells. What are the signs or symptoms? Symptoms of this condition include:  Minor weakness.  Dizziness.  Headache.  Feeling heartbeats that are irregular or faster than normal (palpitations).  Shortness of breath, especially with exercise.  Paleness.  Cold sensitivity.  Indigestion.  Nausea.  Difficulty sleeping.  Difficulty concentrating. Symptoms may occur suddenly or develop slowly. If your anemia is mild, you may not have symptoms. How is this diagnosed? This condition is diagnosed based on:  Blood tests.  Your medical history.  A physical exam.  Bone marrow biopsy. Your health care provider may also check your stool (feces) for blood and may do additional testing to look for the cause of your bleeding. You may also have other tests, including:  Imaging tests, such as a CT scan or MRI.  Endoscopy.  Colonoscopy. How is this treated? Treatment for this condition depends on the cause. If you continue to lose a lot of blood, you may  need to be treated at a hospital. Treatment may include:  Taking supplements of iron, vitamin S31, or folic acid.  Taking a hormone medicine (erythropoietin) that can help to stimulate red blood cell growth.  Having a blood transfusion. This may be needed if you lose a lot of blood.  Making changes to your diet.  Having surgery to remove your spleen. Follow these instructions at home:  Take over-the-counter and prescription medicines only as told by your health care provider.  Take supplements only as told by your health care provider.  Follow any diet instructions that you were given.  Keep all follow-up visits as told by your health care provider. This is important. Contact a health care provider if:  You develop new bleeding anywhere in the body. Get help right away if:  You are very weak.  You are short of breath.  You have pain in your abdomen or chest.  You are dizzy or feel faint.  You have trouble concentrating.  You have bloody or black, tarry stools.  You vomit repeatedly or you vomit up blood. Summary  Anemia is a condition in which you do not have enough red blood cells or enough of a substance in your red blood cells that carries oxygen (hemoglobin).  Symptoms may occur suddenly or develop slowly.  If your anemia is mild, you may not have symptoms.  This condition is diagnosed with blood tests as well as a medical history and physical exam. Other tests may be needed.  Treatment for this condition depends on the cause of the anemia. This information is not intended to replace advice given to you by  your health care provider. Make sure you discuss any questions you have with your health care provider. Document Revised: 02/22/2017 Document Reviewed: 04/13/2016 Elsevier Patient Education  [place].

## 2020-02-02 NOTE — Progress Notes (Signed)
Subjective:  Patient ID: Cory Leonard, male    DOB: 11-Nov-1928  Age: 86 y.o. MRN: [id number]  CC: Anemia and Hypertension  This visit occurred during the SARS-CoV-2 public health emergency.  Safety protocols were in place, including screening questions prior to the visit, additional usage of staff PPE, and extensive cleaning of exam room while observing appropriate contact time as indicated for disinfecting solutions.    HPI Cory Leonard presents for f/up - He has his baseline LE edema that is not painful. He denies CP, DOE, fatigue, palpitations.  Outpatient Medications Prior to Visit  Medication Sig Dispense Refill  . Calcium Carb-Cholecalciferol (CALCIUM-VITAMIN D) 500-200 MG-UNIT tablet Take 1 tablet by mouth daily.    . cyanocobalamin 500 MCG tablet Take 500 mcg by mouth daily.    Marland Kitchen ketorolac (ACULAR) 0.5 % ophthalmic solution 1 drop daily.    Marland Kitchen lisinopril-hydrochlorothiazide (ZESTORETIC) 20-25 MG tablet TAKE 1/2 TABLET BY MOUTH DAILY 45 tablet 1  . simvastatin (ZOCOR) 10 MG tablet TAKE 1 TABLET(10 MG) BY MOUTH DAILY 90 tablet 1  . timolol (BETIMOL) 0.5 % ophthalmic solution 1 drop 2 (two) times daily.    . furosemide (LASIX) 20 MG tablet Take 20 mg by mouth every morning.    . traMADol (ULTRAM) 50 MG tablet Take 1 tablet (50 mg total) by mouth every 12 (twelve) hours as needed. 180 tablet 1   No facility-administered medications prior to visit.    ROS Review of Systems  Constitutional: Negative for diaphoresis and fatigue.  HENT: Negative.   Eyes: Negative.   Respiratory: Negative for cough, chest tightness, shortness of breath and wheezing.   Cardiovascular: Positive for leg swelling. Negative for chest pain and palpitations.  Gastrointestinal: Negative for abdominal pain, diarrhea, nausea and vomiting.  Endocrine: Negative.   Genitourinary: Negative.  Negative for difficulty urinating.  Musculoskeletal: Negative.  Negative for arthralgias.  Skin: Negative.  Negative  for color change and rash.  Neurological: Negative.  Negative for dizziness, weakness and light-headedness.  Hematological: Does not bruise/bleed easily.  Psychiatric/Behavioral: Negative.     Objective:  BP (!) 146/86   Pulse 77   Temp 98.3 F (36.8 C) (Oral)   Resp 16   Ht 6' (1.829 m)   Wt 178 lb (80.7 kg)   SpO2 97%   BMI 24.14 kg/m   BP Readings from Last 3 Encounters:  02/02/20 (!) 146/86  07/30/19 (!) 144/76  11/26/18 136/64    Wt Readings from Last 3 Encounters:  02/02/20 178 lb (80.7 kg)  07/30/19 175 lb 4 oz (79.5 kg)  11/26/18 179 lb (81.2 kg)    Physical Exam Vitals reviewed.  Constitutional:      Appearance: Normal appearance.  HENT:     Nose: Nose normal.     Mouth/Throat:     Mouth: Mucous membranes are moist.  Eyes:     General: No scleral icterus.    Conjunctiva/sclera: Conjunctivae normal.  Cardiovascular:     Rate and Rhythm: Normal rate and regular rhythm.     Pulses:          Carotid pulses are 1+ on the right side and 1+ on the left side.      Radial pulses are 1+ on the right side and 1+ on the left side.       Femoral pulses are 1+ on the right side and 1+ on the left side.      Popliteal pulses are 1+ on the right side and  1+ on the left side.       Dorsalis pedis pulses are 1+ on the right side and 1+ on the left side.       Posterior tibial pulses are 1+ on the right side and 1+ on the left side.     Heart sounds: No murmur heard.  No gallop.   Pulmonary:     Effort: Pulmonary effort is normal.     Breath sounds: No stridor. No wheezing, rhonchi or rales.  Abdominal:     General: Abdomen is flat.     Palpations: There is no mass.     Tenderness: There is no abdominal tenderness. There is no guarding.  Musculoskeletal:     Cervical back: Neck supple.     Right lower leg: 1+ Pitting Edema present.     Left lower leg: 1+ Pitting Edema present.  Lymphadenopathy:     Cervical: No cervical adenopathy.  Skin:    General: Skin is  warm and dry.     Coloration: Skin is not pale.     Findings: No bruising.  Neurological:     General: No focal deficit present.     Mental Status: He is alert.  Psychiatric:        Mood and Affect: Mood normal.        Behavior: Behavior normal.     Lab Results  Component Value Date   WBC 5.6 02/02/2020   HGB 12.8 (L) 02/02/2020   HCT 38.4 (L) 02/02/2020   PLT 130.0 (L) 02/02/2020   GLUCOSE 99 02/02/2020   CHOL 147 11/26/2018   TRIG 101.0 11/26/2018   HDL 42.90 11/26/2018   LDLCALC 84 11/26/2018   ALT 12 11/28/2017   AST 13 11/28/2017   NA 139 02/02/2020   K 5.0 02/02/2020   CL 103 02/02/2020   CREATININE 3.72 (H) 02/02/2020   BUN 36 (H) 02/02/2020   CO2 30 02/02/2020   TSH 2.38 11/26/2018   HGBA1C 6.5 02/02/2020   MICROALBUR <0.7 07/30/2019    No results found.  Assessment & Plan:   Cory Leonard was seen today for anemia and hypertension.  Diagnoses and all orders for this visit:  Type 2 diabetes mellitus with complication, without long-term current use of insulin ([place])- His blood sugar is adequately well controlled. -     Basic metabolic panel; Future -     Hemoglobin A1c; Future -     Hemoglobin A1c -     Basic metabolic panel  Hypertension, renal disease- His BP is well controlled. -     Basic metabolic panel; Future -     Basic metabolic panel  Renal insufficiency, mild- He has had some decline in his renal function and is has pre-renal azotemia. I have asked him to stop taking the loop diuretic. -     Basic metabolic panel; Future -     Basic metabolic panel  Hyperlipidemia LDL goal <100- He is doing well on the statin.  Thrombocytopenia ([place])- His H/H are stable. Plt count remains mildly low with no hx of bleeding/bruising. Evaluation for secondary causes is negative. This is c/w ITP. Will follow. -     CBC with Differential/Platelet; Future -     Vitamin B12; Future -     Folate; Future -     Folate -     Vitamin B12 -     CBC with  Differential/Platelet  Flu vaccine need -     Flu Vaccine QUAD High Dose(Fluad)  Need for shingles vaccine -     Zoster Vaccine Adjuvanted [facility]) injection; Inject 0.5 mLs into the muscle once for 1 dose.   I have discontinued Cory Leonard's traMADol and furosemide. I am also having him start on Shingrix. Additionally, I am having him maintain his calcium-vitamin D, vitamin B-12, timolol, ketorolac, lisinopril-hydrochlorothiazide, and simvastatin.  Meds ordered this encounter  Medications  . Zoster Vaccine Adjuvanted [facility]) injection    Sig: Inject 0.5 mLs into the muscle once for 1 dose.    Dispense:  0.5 mL    Refill:  1   I spent 50 minutes in preparing to see the patient by review of recent labs, imaging and procedures, obtaining and reviewing separately obtained history, communicating with the patient and family or caregiver, ordering medications, tests or procedures, and documenting clinical information in the EHR including the differential Dx, treatment, and any further evaluation and other management of 1. Type 2 diabetes mellitus with complication, without long-term current use of insulin ([place]) 2. Hypertension, renal disease 3. Renal insufficiency, mild 4. Hyperlipidemia LDL goal <100 5. Thrombocytopenia ([place])     Follow-up: Return in about 6 months (around 08/01/2020).  Scarlette Calico, MD

## 2020-03-02 ENCOUNTER — Other Ambulatory Visit: Payer: Self-pay | Admitting: Internal Medicine

## 2020-03-02 DIAGNOSIS — I129 Hypertensive chronic kidney disease with stage 1 through stage 4 chronic kidney disease, or unspecified chronic kidney disease: Secondary | ICD-10-CM

## 2020-03-14 ENCOUNTER — Other Ambulatory Visit: Payer: Self-pay | Admitting: Internal Medicine

## 2020-03-14 ENCOUNTER — Ambulatory Visit: Payer: Medicare Other | Attending: Family

## 2020-03-14 DIAGNOSIS — E785 Hyperlipidemia, unspecified: Secondary | ICD-10-CM

## 2020-03-14 DIAGNOSIS — Z23 Encounter for immunization: Secondary | ICD-10-CM

## 2020-07-19 NOTE — Progress Notes (Signed)
   Covid-19 Vaccination Clinic  Name:  Cory Leonard    MRN: [id number] DOB: Jan 24, 1929  07/19/2020  Cory Leonard was observed post Covid-19 immunization for 15 minutes without incident. He was provided with Vaccine Information Sheet and instruction to access the V-Safe system.   Cory Leonard was instructed to call 911 with any severe reactions post vaccine: Marland Kitchen Difficulty breathing  . Swelling of face and throat  . A fast heartbeat  . A bad rash all over body  . Dizziness and weakness   Immunizations Administered    Name Date Dose VIS Date Route   Moderna Covid-19 Booster Vaccine 03/14/2020  9:45 AM 0.25 mL 01/13/2020 Intramuscular   Manufacturer: Moderna   Lot: [id number]   [id number]

## 2020-08-25 ENCOUNTER — Telehealth: Payer: Self-pay | Admitting: Internal Medicine

## 2020-08-25 DIAGNOSIS — I129 Hypertensive chronic kidney disease with stage 1 through stage 4 chronic kidney disease, or unspecified chronic kidney disease: Secondary | ICD-10-CM

## 2020-08-30 NOTE — Telephone Encounter (Signed)
   Spouse calling to report patient has no Lisinopril remaining after today and the pharmacy states refill is denied.  Please call

## 2020-08-30 NOTE — Telephone Encounter (Signed)
I spoke to pt's pharmacy. Pharmacist stated that pt did not request for new Rx to be filled. She stated that it could be filled and ready within the next few hours. Pt and his wife has been informed.

## 2020-09-14 ENCOUNTER — Emergency Department (HOSPITAL_COMMUNITY): Payer: Medicare Other

## 2020-09-14 ENCOUNTER — Other Ambulatory Visit: Payer: Self-pay

## 2020-09-14 ENCOUNTER — Encounter (HOSPITAL_COMMUNITY): Payer: Self-pay

## 2020-09-14 ENCOUNTER — Emergency Department (HOSPITAL_COMMUNITY)
Admission: EM | Admit: 2020-09-14 | Discharge: 2020-09-14 | Disposition: A | Discharge status: Home / Self Care | Payer: Medicare Other | Attending: Emergency Medicine | Admitting: Emergency Medicine

## 2020-09-14 DIAGNOSIS — I129 Hypertensive chronic kidney disease with stage 1 through stage 4 chronic kidney disease, or unspecified chronic kidney disease: Secondary | ICD-10-CM | POA: Insufficient documentation

## 2020-09-14 DIAGNOSIS — R197 Diarrhea, unspecified: Secondary | ICD-10-CM | POA: Diagnosis not present

## 2020-09-14 DIAGNOSIS — D649 Anemia, unspecified: Secondary | ICD-10-CM | POA: Diagnosis not present

## 2020-09-14 DIAGNOSIS — Z79899 Other long term (current) drug therapy: Secondary | ICD-10-CM | POA: Insufficient documentation

## 2020-09-14 DIAGNOSIS — N289 Disorder of kidney and ureter, unspecified: Secondary | ICD-10-CM

## 2020-09-14 DIAGNOSIS — N189 Chronic kidney disease, unspecified: Secondary | ICD-10-CM | POA: Insufficient documentation

## 2020-09-14 DIAGNOSIS — D696 Thrombocytopenia, unspecified: Secondary | ICD-10-CM | POA: Insufficient documentation

## 2020-09-14 DIAGNOSIS — R339 Retention of urine, unspecified: Secondary | ICD-10-CM | POA: Insufficient documentation

## 2020-09-14 DIAGNOSIS — R1030 Lower abdominal pain, unspecified: Secondary | ICD-10-CM | POA: Insufficient documentation

## 2020-09-14 DIAGNOSIS — E1122 Type 2 diabetes mellitus with diabetic chronic kidney disease: Secondary | ICD-10-CM | POA: Diagnosis not present

## 2020-09-14 LAB — URINALYSIS, ROUTINE W REFLEX MICROSCOPIC
Bilirubin Urine: NEGATIVE
Glucose, UA: NEGATIVE mg/dL
Hgb urine dipstick: NEGATIVE
Ketones, ur: NEGATIVE mg/dL
Leukocytes,Ua: NEGATIVE
Nitrite: NEGATIVE
Protein, ur: NEGATIVE mg/dL
Specific Gravity, Urine: 1.01 (ref 1.005–1.030)
pH: 6 (ref 5.0–8.0)

## 2020-09-14 LAB — CBC WITH DIFFERENTIAL/PLATELET
Abs Immature Granulocytes: 0.03 10*3/uL (ref 0.00–0.07)
Basophils Absolute: 0 10*3/uL (ref 0.0–0.1)
Basophils Relative: 0 %
Eosinophils Absolute: 0 10*3/uL (ref 0.0–0.5)
Eosinophils Relative: 0 %
HCT: 37.5 % — ABNORMAL LOW (ref 39.0–52.0)
Hemoglobin: 12.5 g/dL — ABNORMAL LOW (ref 13.0–17.0)
Immature Granulocytes: 0 %
Lymphocytes Relative: 5 %
Lymphs Abs: 0.5 10*3/uL — ABNORMAL LOW (ref 0.7–4.0)
MCH: 32.3 pg (ref 26.0–34.0)
MCHC: 33.3 g/dL (ref 30.0–36.0)
MCV: 96.9 fL (ref 80.0–100.0)
Monocytes Absolute: 0.8 10*3/uL (ref 0.1–1.0)
Monocytes Relative: 8 %
Neutro Abs: 8.6 10*3/uL — ABNORMAL HIGH (ref 1.7–7.7)
Neutrophils Relative %: 87 %
Platelets: 122 10*3/uL — ABNORMAL LOW (ref 150–400)
RBC: 3.87 MIL/uL — ABNORMAL LOW (ref 4.22–5.81)
RDW: 12.9 % (ref 11.5–15.5)
WBC: 10 10*3/uL (ref 4.0–10.5)
nRBC: 0 % (ref 0.0–0.2)

## 2020-09-14 LAB — COMPREHENSIVE METABOLIC PANEL
ALT: 17 U/L (ref 0–44)
AST: 18 U/L (ref 15–41)
Albumin: 4.1 g/dL (ref 3.5–5.0)
Alkaline Phosphatase: 81 U/L (ref 38–126)
Anion gap: 9 (ref 5–15)
BUN: 35 mg/dL — ABNORMAL HIGH (ref 8–23)
CO2: 23 mmol/L (ref 22–32)
Calcium: 9.1 mg/dL (ref 8.9–10.3)
Chloride: 106 mmol/L (ref 98–111)
Creatinine, Ser: 3.79 mg/dL — ABNORMAL HIGH (ref 0.61–1.24)
GFR, Estimated: 15 mL/min — ABNORMAL LOW (ref >60)
Glucose, Bld: 124 mg/dL — ABNORMAL HIGH (ref 70–99)
Potassium: 4.9 mmol/L (ref 3.5–5.1)
Sodium: 138 mmol/L (ref 135–145)
Total Bilirubin: 0.9 mg/dL (ref 0.3–1.2)
Total Protein: 7.1 g/dL (ref 6.5–8.1)

## 2020-09-14 LAB — LIPASE, BLOOD: Lipase: 35 U/L (ref 11–51)

## 2020-09-14 MED ORDER — SODIUM CHLORIDE 0.9 % IV BOLUS
500.0000 mL | Freq: Once | INTRAVENOUS | Status: AC
  Administered 2020-09-14: 500 mL via INTRAVENOUS

## 2020-09-14 MED ORDER — CIPROFLOXACIN HCL 500 MG PO TABS: 500.0000 mg | ORAL_TABLET | Freq: Two times a day (BID) | ORAL | 0 refills | Status: DC

## 2020-09-14 MED ORDER — MORPHINE SULFATE (PF) 4 MG/ML IV SOLN
4.0000 mg | Freq: Once | INTRAVENOUS | Status: AC
  Administered 2020-09-14: 4 mg via INTRAVENOUS
  Filled 2020-09-14: qty 1

## 2020-09-14 MED ORDER — CIPROFLOXACIN HCL 500 MG PO TABS
500.0000 mg | ORAL_TABLET | Freq: Once | ORAL | Status: AC
  Administered 2020-09-14: 500 mg via ORAL
  Filled 2020-09-14: qty 1

## 2020-09-14 NOTE — ED Triage Notes (Signed)
Patient BIB [facility] from home with complaints of diarrhea and abdominal pain and burning with urination that started at 12pm after he ate soup. He had diarrhea 3 times after he ate the soup, then started having burning while urinating.

## 2020-09-14 NOTE — Discharge Instructions (Addendum)
Return for any problem.   Follow up with Urology as instructed.

## 2020-09-14 NOTE — ED Provider Notes (Signed)
[facility] DEPT Provider Note   CSN: [id number] Arrival date & time: 09/14/20  0115     History Chief Complaint  Patient presents with   Abdominal Pain   Diarrhea    Cory Leonard is a 87 y.o. male.  The history is provided by the patient.  Abdominal Pain Associated symptoms: diarrhea   Diarrhea Associated symptoms: abdominal pain   He has history of hypertension, diabetes, hyperlipidemia, chronic kidney disease and comes in because of mid abdominal pain and diarrhea which been present all day.  Pain is severe and he rates it at 10/10.  He has had 3 watery bowel movements.  Pain is not improved after bowel movement.  He has also noted burning when he urinates without urinary urgency or frequency or tenesmus.  He denies any flank pain.  He denies nausea or vomiting.  He denies fever, chills, sweats.  He has not taken anything for his pain.  Nothing makes his pain better, nothing makes it worse.   Past Medical History:  Diagnosis Date   Chronic kidney disease    Hyperlipidemia    Hypertension     Patient Active Problem List   Diagnosis Date Noted   Flu vaccine need 02/02/2020   Need for shingles vaccine 02/02/2020   Thrombocytopenia ([place]) 11/26/2018   Routine general medical examination at a health care facility 02/21/2018   Primary osteoarthritis of both knees 11/28/2017   Type 2 diabetes mellitus with complication, without long-term current use of insulin ([place]) 01/09/2017   Renal insufficiency, mild 01/09/2017   Hyperlipidemia LDL goal <100 01/09/2017   Hypertension, renal disease 01/09/2017   Open-angle glaucoma of both eyes 01/09/2017    History reviewed. No pertinent surgical history.     Family History  Family history unknown: Yes    Social History   Tobacco Use   Smoking status: Never   Smokeless tobacco: Never  Substance Use Topics   Alcohol use: No   Drug use: No    Home Medications Prior to Admission medications    Medication Sig Start Date End Date Taking? Authorizing Provider  Calcium Carb-Cholecalciferol (CALCIUM-VITAMIN D) 500-200 MG-UNIT tablet Take 1 tablet by mouth daily.    [provider]  cyanocobalamin 500 MCG tablet Take 500 mcg by mouth daily.    [provider]  ketorolac (ACULAR) 0.5 % ophthalmic solution 1 drop daily. 06/05/19   [provider]  lisinopril-hydrochlorothiazide (ZESTORETIC) 20-25 MG tablet TAKE 1/2 TABLET BY MOUTH DAILY 08/25/20   Janith Lima, MD  simvastatin (ZOCOR) 10 MG tablet TAKE 1 TABLET(10 MG) BY MOUTH DAILY 03/14/20   Janith Lima, MD  timolol (BETIMOL) 0.5 % ophthalmic solution 1 drop 2 (two) times daily.    [provider]    Allergies    Penicillins and Amlodipine  Review of Systems   Review of Systems  Gastrointestinal:  Positive for abdominal pain and diarrhea.  All other systems reviewed and are negative.  Physical Exam Updated Vital Signs BP (!) 163/79   Pulse 79   Temp 98.8 F (37.1 C) (Oral)   Resp 15   Ht 6' (1.829 m)   Wt 80.7 kg   SpO2 98%   BMI 24.13 kg/m   Physical Exam Vitals and nursing note reviewed.  87 year old male, resting comfortably and in no acute distress. Vital signs are significant for elevated blood pressure. Oxygen saturation is 98%, which is normal. Head is normocephalic and atraumatic. PERRLA, EOMI. Oropharynx is  clear. Neck is nontender and supple without adenopathy or JVD. Back is nontender and there is no CVA tenderness. Lungs are clear without rales, wheezes, or rhonchi. Chest is nontender. Heart has regular rate and rhythm without murmur. Abdomen is soft, flat, with moderate pain across the mid abdomen.  There is no rebound or guarding.  There are no masses or hepatosplenomegaly and peristalsis is hypoactive. Extremities have no cyanosis or edema, full range of motion is present. Skin is warm and dry without rash. Neurologic: Mental status is normal, cranial nerves are  intact, there are no motor or sensory deficits.  ED Results / Procedures / Treatments   Labs (all labs ordered are listed, but only abnormal results are displayed) Labs Reviewed  COMPREHENSIVE METABOLIC PANEL - Abnormal; Notable for the following components:      Result Value   Glucose, Bld 124 (*)    BUN 35 (*)    Creatinine, Ser 3.79 (*)    GFR, Estimated 15 (*)    All other components within normal limits  CBC WITH DIFFERENTIAL/PLATELET - Abnormal; Notable for the following components:   RBC 3.87 (*)    Hemoglobin 12.5 (*)    HCT 37.5 (*)    Platelets 122 (*)    Neutro Abs 8.6 (*)    Lymphs Abs 0.5 (*)    All other components within normal limits  LIPASE, BLOOD  URINALYSIS, ROUTINE W REFLEX MICROSCOPIC    Radiology CT ABDOMEN PELVIS WO CONTRAST  Result Date: 09/14/2020 CLINICAL DATA:  Abdominal pain, diarrhea and burning with urination. Chronic kidney disease. EXAM: CT ABDOMEN AND PELVIS WITHOUT CONTRAST TECHNIQUE: Multidetector CT imaging of the abdomen and pelvis was performed following the standard protocol without IV contrast. COMPARISON:  None. FINDINGS: Lower chest: Bilateral lower lobe subsegmental atelectasis. Tiny hiatal hernia. Hepatobiliary: No focal liver abnormality. No gallstones, gallbladder wall thickening, or pericholecystic fluid. No biliary dilatation. Pancreas: No focal lesion. Normal pancreatic contour. No surrounding inflammatory changes. No main pancreatic ductal dilatation. Spleen: Normal in size without focal abnormality. Adrenals/Urinary Tract: No adrenal nodule bilaterally. Bilateral mild hydronephrosis and moderate hydroureter with associated urothelial thickening and perinephric fat stranding. The urinary bladder is distended with urine. Multiple fluid density lesions within the kidneys likely represent simple renal cysts. One of the cysts is noted to contain a in peripheral calcification (4:93). Otherwise no nephroureterolithiasis. Stomach/Bowel: Stomach  is within normal limits. No evidence of bowel wall thickening or dilatation. Distended cecum with stool. Redundant sigmoid colon. Few scattered colonic diverticula. Appendix appears normal. Vascular/Lymphatic: No abdominal aorta or iliac aneurysm. At least moderate atherosclerotic plaque of the aorta and its branches. No abdominal, pelvic, or inguinal lymphadenopathy. Reproductive: The prostate is enlarged measuring up to 5 cm. Other: No intraperitoneal free fluid. No intraperitoneal free gas. No organized fluid collection. Musculoskeletal: No abdominal wall hernia or abnormality. No suspicious lytic or blastic osseous lesions. No acute displaced fracture. Multilevel degenerative changes of the spine. IMPRESSION: 1. Urinary bladder distended with urine with bilateral mild to moderate hydroureteronephrosis. No radiopaque nephroureterolithiasis bilaterally. This may reflect reflect chronic changes of obstructive uropathy or reflux. Question urothelial thickening. Correlate with urinalysis for infection. 2. Cecum distended with stool.  No definite bowel obstruction. 3. Bilateral simple appearing renal cysts. Correlation with prior cross-sectional imaging would be of value. 4. Prostatomegaly. 5. Tiny hiatal hernia. Electronically Signed   By: Iven Finn M.D.   On: 09/14/2020 05:37    Procedures Procedures   Medications Ordered in ED Medications  sodium chloride  0.9 % bolus 500 mL (0 mLs Intravenous Stopped 09/14/20 0607)  morphine 4 MG/ML injection 4 mg (4 mg Intravenous Given 09/14/20 0340)    ED Course  I have reviewed the triage vital signs and the nursing notes.  Pertinent labs & imaging results that were available during my care of the patient were reviewed by me and considered in my medical decision making (see chart for details).   MDM Rules/Calculators/A&P                         Abdominal pain with diarrhea and dysuria, cause is unclear.  Consider urinary tract infection, pyelonephritis,  diverticulitis, urolithiasis.  Will check screening labs, urinalysis and send for CT of abdomen and pelvis.  Old records are reviewed, and he has no relevant past visits, no prior abdominal imaging.  Labs show stable renal insufficiency, stable anemia, stable thrombocytopenia.  CT scan is significant for evidence of bladder distention and bilateral hydronephrosis.  I suspect this is the cause of his pain.  Bladder scan shows bladder as 950 mL.  Foley catheter is placed and urinalysis is pending.  Case is signed out to Dr. Francia Greaves.  Final Clinical Impression(s) / ED Diagnoses Final diagnoses:  Lower abdominal pain  Urinary retention  Renal insufficiency  Normochromic normocytic anemia  Thrombocytopenia ([place])    Rx / DC Orders ED Discharge Orders     None        Delora Fuel, MD [id number] [phone]

## 2020-09-14 NOTE — ED Notes (Signed)
Leg bag provided for patient and education regarding use provided.

## 2020-09-14 NOTE — ED Notes (Signed)
Patient transported to CT 

## 2020-09-14 NOTE — ED Provider Notes (Signed)
Patient seen after prior ED provider.  Patient is improved.  Foley catheter is draining well.  Repeat abdominal exam is benign.  Patient understands need for close outpatient follow-up with both his PCP and also with urology.  Will provide Cipro course for treatment of suspected prostatitis with urinary retention.  Patient understands need for close follow-up.  Strict return precautions given and understood.   Valarie Merino, MD 09/14/20 [phone]

## 2020-09-15 LAB — URINE CULTURE: Culture: NO GROWTH

## 2020-09-21 ENCOUNTER — Encounter: Payer: Self-pay | Admitting: Internal Medicine

## 2020-09-21 ENCOUNTER — Ambulatory Visit: Payer: Medicare Other | Admitting: Internal Medicine

## 2020-09-21 ENCOUNTER — Other Ambulatory Visit: Payer: Self-pay

## 2020-09-21 VITALS — BP 134/68 | HR 82 | Temp 98.1°F | Resp 16 | Ht 72.0 in | Wt 184.0 lb

## 2020-09-21 DIAGNOSIS — N410 Acute prostatitis: Secondary | ICD-10-CM | POA: Diagnosis not present

## 2020-09-21 DIAGNOSIS — N138 Other obstructive and reflux uropathy: Secondary | ICD-10-CM

## 2020-09-21 DIAGNOSIS — N401 Enlarged prostate with lower urinary tract symptoms: Secondary | ICD-10-CM | POA: Diagnosis not present

## 2020-09-21 MED ORDER — ALFUZOSIN HCL ER 10 MG PO TB24: 10.0000 mg | ORAL_TABLET | Freq: Every day | ORAL | 1 refills | Status: DC

## 2020-09-21 MED ORDER — DUTASTERIDE 0.5 MG PO CAPS: 0.5000 mg | ORAL_CAPSULE | Freq: Every day | ORAL | 1 refills | Status: DC

## 2020-09-21 NOTE — Progress Notes (Signed)
Subjective:  Patient ID: Cory Leonard, male    DOB: 02/14/1929  Age: 87 y.o. MRN: [id number]  CC: Follow-up  This visit occurred during the SARS-CoV-2 public health emergency.  Safety protocols were in place, including screening questions prior to the visit, additional usage of staff PPE, and extensive cleaning of exam room while observing appropriate contact time as indicated for disinfecting solutions.    HPI TREVELL STJEAN presents for f/up -  He was seen in the ED a week ago for urinary retention with hydronephrosis.  He was treated for acute prostatitis with a 7-day course of ciprofloxacin which he has completed.  The Foley catheter remains in place and he would like to have it removed.  Outpatient Medications Prior to Visit  Medication Sig Dispense Refill   Calcium Carb-Cholecalciferol (CALCIUM-VITAMIN D) 500-200 MG-UNIT tablet Take 1 tablet by mouth daily.     cyanocobalamin 500 MCG tablet Take 500 mcg by mouth daily.     ketorolac (ACULAR) 0.5 % ophthalmic solution 1 drop daily.     lisinopril-hydrochlorothiazide (ZESTORETIC) 20-25 MG tablet TAKE 1/2 TABLET BY MOUTH DAILY 45 tablet 1   simvastatin (ZOCOR) 10 MG tablet TAKE 1 TABLET(10 MG) BY MOUTH DAILY 90 tablet 1   timolol (BETIMOL) 0.5 % ophthalmic solution 1 drop 2 (two) times daily.     ciprofloxacin (CIPRO) 500 MG tablet Take 1 tablet (500 mg total) by mouth 2 (two) times daily. 14 tablet 0   No facility-administered medications prior to visit.    ROS Review of Systems  Constitutional:  Negative for chills and fever.  HENT: Negative.    Eyes: Negative.   Respiratory:  Negative for cough and shortness of breath.   Cardiovascular:  Positive for leg swelling. Negative for chest pain and palpitations.  Gastrointestinal:  Negative for abdominal pain.  Endocrine: Negative.   Genitourinary:  Negative for difficulty urinating, dysuria, flank pain and hematuria.  Neurological: Negative.   Hematological:  Negative for  adenopathy. Does not bruise/bleed easily.   Objective:  BP 134/68 (BP Location: Left Arm, Patient Position: Sitting, Cuff Size: Large)   Pulse 82   Temp 98.1 F (36.7 C) (Oral)   Resp 16   Ht 6' (1.829 m)   Wt 184 lb (83.5 kg)   SpO2 96%   BMI 24.95 kg/m   BP Readings from Last 3 Encounters:  09/21/20 134/68  09/14/20 138/70  02/02/20 (!) 146/86    Wt Readings from Last 3 Encounters:  09/21/20 184 lb (83.5 kg)  09/14/20 177 lb 14.6 oz (80.7 kg)  02/02/20 178 lb (80.7 kg)    Physical Exam Vitals reviewed.  Constitutional:      Appearance: Normal appearance.  Eyes:     Conjunctiva/sclera: Conjunctivae normal.  Cardiovascular:     Rate and Rhythm: Normal rate and regular rhythm.  Pulmonary:     Effort: Pulmonary effort is normal.     Breath sounds: No stridor. No wheezing, rhonchi or rales.  Abdominal:     General: Abdomen is flat. Bowel sounds are normal. There is no distension.     Palpations: Abdomen is soft. There is no hepatomegaly, splenomegaly or mass.     Tenderness: There is no abdominal tenderness.  Genitourinary:    Pubic Area: No rash.      Penis: Normal and circumcised.      Testes: Normal.     Comments: Foley catheter deflated and easily removed without complication Musculoskeletal:        General:  Normal range of motion.     Right lower leg: 2+ Pitting Edema present.     Left lower leg: 2+ Pitting Edema present.  Skin:    General: Skin is warm and dry.  Neurological:     General: No focal deficit present.     Mental Status: He is alert.    Lab Results  Component Value Date   WBC 10.0 09/14/2020   HGB 12.5 (L) 09/14/2020   HCT 37.5 (L) 09/14/2020   PLT 122 (L) 09/14/2020   GLUCOSE 124 (H) 09/14/2020   CHOL 147 11/26/2018   TRIG 101.0 11/26/2018   HDL 42.90 11/26/2018   LDLCALC 84 11/26/2018   ALT 17 09/14/2020   AST 18 09/14/2020   NA 138 09/14/2020   K 4.9 09/14/2020   CL 106 09/14/2020   CREATININE 3.79 (H) 09/14/2020   BUN 35 (H)  09/14/2020   CO2 23 09/14/2020   TSH 2.38 11/26/2018   HGBA1C 6.5 02/02/2020   MICROALBUR <0.7 07/30/2019    CT ABDOMEN PELVIS WO CONTRAST  Result Date: 09/14/2020 CLINICAL DATA:  Abdominal pain, diarrhea and burning with urination. Chronic kidney disease. EXAM: CT ABDOMEN AND PELVIS WITHOUT CONTRAST TECHNIQUE: Multidetector CT imaging of the abdomen and pelvis was performed following the standard protocol without IV contrast. COMPARISON:  None. FINDINGS: Lower chest: Bilateral lower lobe subsegmental atelectasis. Tiny hiatal hernia. Hepatobiliary: No focal liver abnormality. No gallstones, gallbladder wall thickening, or pericholecystic fluid. No biliary dilatation. Pancreas: No focal lesion. Normal pancreatic contour. No surrounding inflammatory changes. No main pancreatic ductal dilatation. Spleen: Normal in size without focal abnormality. Adrenals/Urinary Tract: No adrenal nodule bilaterally. Bilateral mild hydronephrosis and moderate hydroureter with associated urothelial thickening and perinephric fat stranding. The urinary bladder is distended with urine. Multiple fluid density lesions within the kidneys likely represent simple renal cysts. One of the cysts is noted to contain a in peripheral calcification (4:93). Otherwise no nephroureterolithiasis. Stomach/Bowel: Stomach is within normal limits. No evidence of bowel wall thickening or dilatation. Distended cecum with stool. Redundant sigmoid colon. Few scattered colonic diverticula. Appendix appears normal. Vascular/Lymphatic: No abdominal aorta or iliac aneurysm. At least moderate atherosclerotic plaque of the aorta and its branches. No abdominal, pelvic, or inguinal lymphadenopathy. Reproductive: The prostate is enlarged measuring up to 5 cm. Other: No intraperitoneal free fluid. No intraperitoneal free gas. No organized fluid collection. Musculoskeletal: No abdominal wall hernia or abnormality. No suspicious lytic or blastic osseous lesions. No  acute displaced fracture. Multilevel degenerative changes of the spine. IMPRESSION: 1. Urinary bladder distended with urine with bilateral mild to moderate hydroureteronephrosis. No radiopaque nephroureterolithiasis bilaterally. This may reflect reflect chronic changes of obstructive uropathy or reflux. Question urothelial thickening. Correlate with urinalysis for infection. 2. Cecum distended with stool.  No definite bowel obstruction. 3. Bilateral simple appearing renal cysts. Correlation with prior cross-sectional imaging would be of value. 4. Prostatomegaly. 5. Tiny hiatal hernia. Electronically Signed   By: Iven Finn M.D.   On: 09/14/2020 05:37    Assessment & Plan:   Eri was seen today for follow-up.  Diagnoses and all orders for this visit:  Benign prostatic hyperplasia with urinary obstruction- After a discussion of the pros and cons he chose to have the Foley catheter removed.  He knows that if he retains urine again he will have to have another urinary catheter placed.  Will treat the BPH with a 5 alpha reductase inhibitor and peripheral alpha-blocker. -     dutasteride (AVODART) 0.5 MG capsule; Take  1 capsule (0.5 mg total) by mouth daily. -     alfuzosin (UROXATRAL) 10 MG 24 hr tablet; Take 1 tablet (10 mg total) by mouth daily with breakfast.  Acute bacterial prostatitis -     CULTURE, URINE COMPREHENSIVE; Future -     Urinalysis, Routine w reflex microscopic; Future -     Urinalysis, Routine w reflex microscopic -     CULTURE, URINE COMPREHENSIVE  I have discontinued Kel A. Chirino's ciprofloxacin. I am also having him start on dutasteride and alfuzosin. Additionally, I am having him maintain his calcium-vitamin D, vitamin B-12, timolol, ketorolac, simvastatin, and lisinopril-hydrochlorothiazide.  Meds ordered this encounter  Medications   dutasteride (AVODART) 0.5 MG capsule    Sig: Take 1 capsule (0.5 mg total) by mouth daily.    Dispense:  90 capsule    Refill:  1    alfuzosin (UROXATRAL) 10 MG 24 hr tablet    Sig: Take 1 tablet (10 mg total) by mouth daily with breakfast.    Dispense:  90 tablet    Refill:  1     Follow-up: Return in about 3 weeks (around 10/12/2020).  Scarlette Calico, MD

## 2020-09-21 NOTE — Patient Instructions (Signed)
Benign Prostatic Hyperplasia  Benign prostatic hyperplasia (BPH) is an enlarged prostate gland that is caused by the normal aging process and not by cancer. The prostate is a walnut-sized gland that is involved in the production of semen. It is located in front of the rectum and below the bladder. The bladder stores urine and the urethra is the tube that carries the urine out of the body. The prostate may get bigger asa man gets older. An enlarged prostate can press on the urethra. This can make it harder to pass urine. The build-up of urine in the bladder can cause infection. Back pressure and infection may progress to bladder damage and kidney (renal) failure. What are the causes? This condition is part of a normal aging process. However, not all men develop problems from this condition. If the prostate enlarges away from the urethra, urine flow will not be blocked. If it enlarges toward the urethra andcompresses it, there will be problems passing urine. What increases the risk? This condition is more likely to develop in men over the age of 50 years. What are the signs or symptoms? Symptoms of this condition include: Getting up often during the night to urinate. Needing to urinate frequently during the day. Difficulty starting urine flow. Decrease in size and strength of your urine stream. Leaking (dribbling) after urinating. Inability to pass urine. This needs immediate treatment. Inability to completely empty your bladder. Pain when you pass urine. This is more common if there is also an infection. Urinary tract infection (UTI). How is this diagnosed? This condition is diagnosed based on your medical history, a physical exam, and your symptoms. Tests will also be done, such as: A post-void bladder scan. This measures any amount of urine that may remain in your bladder after you finish urinating. A digital rectal exam. In a rectal exam, your health care provider checks your prostate by  putting a lubricated, gloved finger into your rectum to feel the back of your prostate gland. This exam detects the size of your gland and any abnormal lumps or growths. An exam of your urine (urinalysis). A prostate specific antigen (PSA) screening. This is a blood test used to screen for prostate cancer. An ultrasound. This test uses sound waves to electronically produce a picture of your prostate gland. Your health care provider may refer you to a specialist in kidney and prostate diseases (urologist). How is this treated? Once symptoms begin, your health care provider will monitor your condition (active surveillance or watchful waiting). Treatment for this condition will depend on the severity of your condition. Treatment may include: Observation and yearly exams. This may be the only treatment needed if your condition and symptoms are mild. Medicines to relieve your symptoms, including: Medicines to shrink the prostate. Medicines to relax the muscle of the prostate. Surgery in severe cases. Surgery may include: Prostatectomy. In this procedure, the prostate tissue is removed completely through an open incision or with a laparoscope or robotics. Transurethral resection of the prostate (TURP). In this procedure, a tool is inserted through the opening at the tip of the penis (urethra). It is used to cut away tissue of the inner core of the prostate. The pieces are removed through the same opening of the penis. This removes the blockage. Transurethral incision (TUIP). In this procedure, small cuts are made in the prostate. This lessens the prostate's pressure on the urethra. Transurethral microwave thermotherapy (TUMT). This procedure uses microwaves to create heat. The heat destroys and removes a small   amount of prostate tissue. Transurethral needle ablation (TUNA). This procedure uses radio frequencies to destroy and remove a small amount of prostate tissue. Interstitial laser coagulation (ILC).  This procedure uses a laser to destroy and remove a small amount of prostate tissue. Transurethral electrovaporization (TUVP). This procedure uses electrodes to destroy and remove a small amount of prostate tissue. Prostatic urethral lift. This procedure inserts an implant to push the lobes of the prostate away from the urethra. Follow these instructions at home: Take over-the-counter and prescription medicines only as told by your health care provider. Monitor your symptoms for any changes. Contact your health care provider with any changes. Avoid drinking large amounts of liquid before going to bed or out in public. Avoid or reduce how much caffeine or alcohol you drink. Give yourself time when you urinate. Keep all follow-up visits as told by your health care provider. This is important. Contact a health care provider if: You have unexplained back pain. Your symptoms do not get better with treatment. You develop side effects from the medicine you are taking. Your urine becomes very dark or has a bad smell. Your lower abdomen becomes distended and you have trouble passing your urine. Get help right away if: You have a fever or chills. You suddenly cannot urinate. You feel lightheaded, or very dizzy, or you faint. There are large amounts of blood or clots in the urine. Your urinary problems become hard to manage. You develop moderate to severe low back or flank pain. The flank is the side of your body between the ribs and the hip. These symptoms may represent a serious problem that is an emergency. Do not wait to see if the symptoms will go away. Get medical help right away. Call your local emergency services (911 in the [place] Do not drive yourself to the hospital. Summary Benign prostatic hyperplasia (BPH) is an enlarged prostate that is caused by the normal aging process and not by cancer. An enlarged prostate can press on the urethra. This can make it hard to pass urine. This  condition is part of a normal aging process and is more likely to develop in men over the age of 50 years. Get help right away if you suddenly cannot urinate. This information is not intended to replace advice given to you by your health care provider. Make sure you discuss any questions you have with your healthcare provider. Document Revised: 11/19/2019 Document Reviewed: 11/19/2019 Elsevier Patient Education  2022 [employer]  

## 2020-09-22 LAB — URINALYSIS, ROUTINE W REFLEX MICROSCOPIC
Bilirubin Urine: NEGATIVE
Ketones, ur: NEGATIVE
Leukocytes,Ua: NEGATIVE
Nitrite: NEGATIVE
Specific Gravity, Urine: 1.015 (ref 1.000–1.030)
Total Protein, Urine: >=300 — AB
Urine Glucose: NEGATIVE
Urobilinogen, UA: 0.2 (ref 0.0–1.0)
pH: 7 (ref 5.0–8.0)

## 2020-09-23 LAB — CULTURE, URINE COMPREHENSIVE: RESULT:: NO GROWTH

## 2020-10-17 ENCOUNTER — Telehealth: Payer: Self-pay

## 2020-10-17 DIAGNOSIS — M170 Bilateral primary osteoarthritis of knee: Secondary | ICD-10-CM

## 2020-10-17 NOTE — Telephone Encounter (Signed)
DME order has been done and given to PCP

## 2020-10-18 NOTE — Telephone Encounter (Signed)
Order has been faxed again to the new number provided below.

## 2020-10-18 NOTE — Telephone Encounter (Signed)
DME order has been signed and faxed.

## 2020-10-18 NOTE — Telephone Encounter (Signed)
Daughter has called back with a different number [phone]

## 2020-10-18 NOTE — Telephone Encounter (Signed)
   Please re-fax lift chair order to [phone] Per daughter this is the proper number to use

## 2020-10-19 NOTE — Telephone Encounter (Signed)
Cory Leonard called back because they have still not received fax of prescription order for lift chair..wants to know if prescription can be emailed or mailed directly to them  Email: [email or web address] (confidential email)  Mail: [address] [place], [place] [address]

## 2020-10-19 NOTE — Telephone Encounter (Signed)
Cory Leonard has been informed that lift chair order will be mailed to her attn at the below provided address.

## 2020-10-26 ENCOUNTER — Ambulatory Visit: Payer: Medicare Other | Admitting: Internal Medicine

## 2020-10-31 ENCOUNTER — Telehealth: Payer: Self-pay | Admitting: Internal Medicine

## 2020-10-31 NOTE — Telephone Encounter (Signed)
LVM for pt to rtn my call to [phone] to schedule AWV with [facility]. Please schedule this appt if pt calls the office or comes in.

## 2020-11-08 LAB — HM DIABETES EYE EXAM

## 2020-11-18 ENCOUNTER — Other Ambulatory Visit: Payer: Self-pay

## 2020-11-18 ENCOUNTER — Emergency Department (HOSPITAL_COMMUNITY)
Admission: EM | Admit: 2020-11-18 | Discharge: 2020-11-19 | Disposition: A | Discharge status: Home / Self Care | Payer: Medicare Other | Attending: Emergency Medicine | Admitting: Emergency Medicine

## 2020-11-18 ENCOUNTER — Encounter (HOSPITAL_COMMUNITY): Payer: Self-pay

## 2020-11-18 ENCOUNTER — Emergency Department (HOSPITAL_COMMUNITY): Payer: Medicare Other

## 2020-11-18 DIAGNOSIS — I129 Hypertensive chronic kidney disease with stage 1 through stage 4 chronic kidney disease, or unspecified chronic kidney disease: Secondary | ICD-10-CM | POA: Diagnosis not present

## 2020-11-18 DIAGNOSIS — G8918 Other acute postprocedural pain: Secondary | ICD-10-CM | POA: Diagnosis not present

## 2020-11-18 DIAGNOSIS — E1122 Type 2 diabetes mellitus with diabetic chronic kidney disease: Secondary | ICD-10-CM | POA: Insufficient documentation

## 2020-11-18 DIAGNOSIS — K6289 Other specified diseases of anus and rectum: Secondary | ICD-10-CM | POA: Diagnosis not present

## 2020-11-18 DIAGNOSIS — N189 Chronic kidney disease, unspecified: Secondary | ICD-10-CM | POA: Diagnosis not present

## 2020-11-18 DIAGNOSIS — Z79899 Other long term (current) drug therapy: Secondary | ICD-10-CM | POA: Diagnosis not present

## 2020-11-18 LAB — CBC WITH DIFFERENTIAL/PLATELET
Abs Immature Granulocytes: 0.01 10*3/uL (ref 0.00–0.07)
Basophils Absolute: 0 10*3/uL (ref 0.0–0.1)
Basophils Relative: 0 %
Eosinophils Absolute: 0.1 10*3/uL (ref 0.0–0.5)
Eosinophils Relative: 2 %
HCT: 32.2 % — ABNORMAL LOW (ref 39.0–52.0)
Hemoglobin: 10.7 g/dL — ABNORMAL LOW (ref 13.0–17.0)
Immature Granulocytes: 0 %
Lymphocytes Relative: 13 %
Lymphs Abs: 0.7 10*3/uL (ref 0.7–4.0)
MCH: 32.3 pg (ref 26.0–34.0)
MCHC: 33.2 g/dL (ref 30.0–36.0)
MCV: 97.3 fL (ref 80.0–100.0)
Monocytes Absolute: 0.8 10*3/uL (ref 0.1–1.0)
Monocytes Relative: 14 %
Neutro Abs: 3.9 10*3/uL (ref 1.7–7.7)
Neutrophils Relative %: 71 %
Platelets: 116 10*3/uL — ABNORMAL LOW (ref 150–400)
RBC: 3.31 MIL/uL — ABNORMAL LOW (ref 4.22–5.81)
RDW: 13.1 % (ref 11.5–15.5)
WBC: 5.5 10*3/uL (ref 4.0–10.5)
nRBC: 0 % (ref 0.0–0.2)

## 2020-11-18 LAB — URINALYSIS, ROUTINE W REFLEX MICROSCOPIC
Bilirubin Urine: NEGATIVE
Glucose, UA: NEGATIVE mg/dL
Ketones, ur: NEGATIVE mg/dL
Nitrite: NEGATIVE
Protein, ur: NEGATIVE mg/dL
Specific Gravity, Urine: 1.004 — ABNORMAL LOW (ref 1.005–1.030)
WBC, UA: >50 WBC/hpf — ABNORMAL HIGH (ref 0–5)
pH: 8 (ref 5.0–8.0)

## 2020-11-18 LAB — BASIC METABOLIC PANEL
Anion gap: 8 (ref 5–15)
BUN: 32 mg/dL — ABNORMAL HIGH (ref 8–23)
CO2: 26 mmol/L (ref 22–32)
Calcium: 8.8 mg/dL — ABNORMAL LOW (ref 8.9–10.3)
Chloride: 104 mmol/L (ref 98–111)
Creatinine, Ser: 3.47 mg/dL — ABNORMAL HIGH (ref 0.61–1.24)
GFR, Estimated: 16 mL/min — ABNORMAL LOW (ref >60)
Glucose, Bld: 99 mg/dL (ref 70–99)
Potassium: 5.3 mmol/L — ABNORMAL HIGH (ref 3.5–5.1)
Sodium: 138 mmol/L (ref 135–145)

## 2020-11-18 NOTE — ED Triage Notes (Signed)
Pt reports having a urology procedure performed a few days ago and now endorses pain to buttock from a wire used for the procedure.

## 2020-11-18 NOTE — ED Provider Notes (Signed)
[facility] DEPT Provider Note   CSN: [id number] Arrival date & time: 11/18/20  1701     History Chief Complaint  Patient presents with   Post-op Problem    Cory Leonard is a 87 y.o. male.  The history is provided by the patient and medical records.   87 y.o. M with hx of CKD, HLP, HTN, BPH with indwelling foley catheter, here with rectal pain.  He underwent urodynamics testing with Dr. Worthy Flank a few days ago.  He did take the prophylactic antibiotics (macrobid) as prescribed.  States for the past 24 hours or so he has been having pain in his buttock and rectal area where they placed one of the probes.  He has had regular bowel movements since procedure, no blood or melena reported.  Foley has been draining as normal.  No fever or chills.    Past Medical History:  Diagnosis Date   Chronic kidney disease    Hyperlipidemia    Hypertension     Patient Active Problem List   Diagnosis Date Noted   Benign prostatic hyperplasia with urinary obstruction 09/21/2020   Acute bacterial prostatitis 09/21/2020   Flu vaccine need 02/02/2020   Need for shingles vaccine 02/02/2020   Thrombocytopenia ([place]) 11/26/2018   Routine general medical examination at a health care facility 02/21/2018   Primary osteoarthritis of both knees 11/28/2017   Type 2 diabetes mellitus with complication, without long-term current use of insulin ([place]) 01/09/2017   Renal insufficiency, mild 01/09/2017   Hyperlipidemia LDL goal <100 01/09/2017   Hypertension, renal disease 01/09/2017   Open-angle glaucoma of both eyes 01/09/2017    History reviewed. No pertinent surgical history.     Family History  Family history unknown: Yes    Social History   Tobacco Use   Smoking status: Never   Smokeless tobacco: Never  Substance Use Topics   Alcohol use: No   Drug use: No    Home Medications Prior to Admission medications   Medication Sig Start Date End Date Taking?  Authorizing Provider  ciprofloxacin (CIPRO) 500 MG tablet Take 1 tablet (500 mg total) by mouth 2 (two) times daily. 11/19/20  Yes Larene Pickett, PA-C  alfuzosin (UROXATRAL) 10 MG 24 hr tablet Take 1 tablet (10 mg total) by mouth daily with breakfast. 09/21/20   Janith Lima, MD  Calcium Carb-Cholecalciferol (CALCIUM-VITAMIN D) 500-200 MG-UNIT tablet Take 1 tablet by mouth daily.    [provider]  cyanocobalamin 500 MCG tablet Take 500 mcg by mouth daily.    [provider]  dutasteride (AVODART) 0.5 MG capsule Take 1 capsule (0.5 mg total) by mouth daily. 09/21/20   Janith Lima, MD  ketorolac (ACULAR) 0.5 % ophthalmic solution 1 drop daily. 06/05/19   [provider]  lisinopril-hydrochlorothiazide (ZESTORETIC) 20-25 MG tablet TAKE 1/2 TABLET BY MOUTH DAILY 08/25/20   Janith Lima, MD  simvastatin (ZOCOR) 10 MG tablet TAKE 1 TABLET(10 MG) BY MOUTH DAILY 03/14/20   Janith Lima, MD  timolol (BETIMOL) 0.5 % ophthalmic solution 1 drop 2 (two) times daily.    [provider]    Allergies    Penicillins and Amlodipine  Review of Systems   Review of Systems  Gastrointestinal:  Positive for rectal pain.  All other systems reviewed and are negative.  Physical Exam Updated Vital Signs BP (!) 127/59 (BP Location: Right Arm)   Pulse 80   Temp 98 F (36.7 C) (Oral)  Resp 16   SpO2 99%   Physical Exam Vitals and nursing note reviewed.  Constitutional:      Appearance: He is well-developed.  HENT:     Head: Normocephalic and atraumatic.  Eyes:     Conjunctiva/sclera: Conjunctivae normal.     Pupils: Pupils are equal, round, and reactive to light.  Cardiovascular:     Rate and Rhythm: Normal rate and regular rhythm.     Heart sounds: Normal heart sounds.  Pulmonary:     Effort: Pulmonary effort is normal.     Breath sounds: Normal breath sounds.  Abdominal:     General: Bowel sounds are normal.     Palpations: Abdomen is soft.   Genitourinary:    Comments: Exam chaperoned by wife Foley catheter present w/leg bag, draining clear urine There is scabbing and flaking skin noted to head of penis and around shaft, there is no active drainage or bleeding present, scabbed area was removed revealing normal tissue beneath, no tissue crepitus or overlying erythema noted, no induration Rectum normal in appearance without swelling, induration, hemorrhoid or signs of rectal abscess Musculoskeletal:        General: Normal range of motion.     Cervical back: Normal range of motion.  Skin:    General: Skin is warm and dry.  Neurological:     Mental Status: He is alert and oriented to person, place, and time.    ED Results / Procedures / Treatments   Labs (all labs ordered are listed, but only abnormal results are displayed) Labs Reviewed  URINALYSIS, ROUTINE W REFLEX MICROSCOPIC - Abnormal; Notable for the following components:      Result Value   Color, Urine STRAW (*)    APPearance HAZY (*)    Specific Gravity, Urine 1.004 (*)    Hgb urine dipstick MODERATE (*)    Leukocytes,Ua LARGE (*)    WBC, UA >50 (*)    Bacteria, UA RARE (*)    All other components within normal limits  BASIC METABOLIC PANEL - Abnormal; Notable for the following components:   Potassium 5.3 (*)    BUN 32 (*)    Creatinine, Ser 3.47 (*)    Calcium 8.8 (*)    GFR, Estimated 16 (*)    All other components within normal limits  CBC WITH DIFFERENTIAL/PLATELET - Abnormal; Notable for the following components:   RBC 3.31 (*)    Hemoglobin 10.7 (*)    HCT 32.2 (*)    Platelets 116 (*)    All other components within normal limits    EKG None  Radiology CT ABDOMEN PELVIS WO CONTRAST  Result Date: 11/19/2020 CLINICAL DATA:  Worsening rectal pain. EXAM: CT ABDOMEN AND PELVIS WITHOUT CONTRAST TECHNIQUE: Multidetector CT imaging of the abdomen and pelvis was performed following the standard protocol without IV contrast. COMPARISON:  CT abdomen  pelvis dated 09/14/2020. FINDINGS: Evaluation of this exam is limited in the absence of intravenous contrast. Lower chest: Bibasilar linear atelectasis. There is a 9 mm subpleural nodule or scarring in the lingula (23/8). No intra-abdominal free air or free fluid. Hepatobiliary: The liver is unremarkable. No intrahepatic biliary dilatation. The gallbladder is unremarkable. Pancreas: Unremarkable. No pancreatic ductal dilatation or surrounding inflammatory changes. Spleen: Normal in size without focal abnormality. Adrenals/Urinary Tract: The adrenal glands are unremarkable. Bilateral renal cysts measure up to 5 cm in the inferior pole of the right kidney. There is no hydronephrosis or nephrolithiasis on either side. The visualized ureters appear unremarkable. The  urinary bladder is decompressed around a Foley catheter. Stomach/Bowel: There is no bowel obstruction or active inflammation. The appendix is normal. Vascular/Lymphatic: Moderate aortoiliac atherosclerotic disease. The IVC is unremarkable no portal venous gas. There is no adenopathy. Reproductive: Mildly enlarged prostate gland measuring 4.5 cm in transverse axial diameter. There is diffuse edema and thickening of the superficial soft tissues of the penis and visualized scrotum. Clinical correlation is recommended to evaluate for possibility of cellulitis. No drainable fluid collection identified. Other: None Musculoskeletal: Degenerative changes of the spine. No acute osseous pathology. IMPRESSION: 1. Diffuse edema and thickening of the superficial soft tissues of the penis and visualized scrotum. Clinical correlation is recommended to evaluate for possibility of cellulitis. No drainable fluid collection identified. 2. No bowel obstruction. Normal appendix. 3. There is a 9 mm subpleural nodule or scarring in the lingula. Consider one of the following in 3 months for both low-risk and high-risk individuals: (a) repeat chest CT, (b) follow-up PET-CT, or (c)  tissue sampling. This recommendation follows the consensus statement: Guidelines for Management of Incidental Pulmonary Nodules Detected on CT Images: From the Fleischner Society 2017; Radiology 2017; 284:228-243. 4. Aortic Atherosclerosis (ICD10-I70.0). Electronically Signed   By: Anner Crete M.D.   On: 11/19/2020 01:32    Procedures Procedures   Medications Ordered in ED Medications - No data to display  ED Course  I have reviewed the triage vital signs and the nursing notes.  Pertinent labs & imaging results that were available during my care of the patient were reviewed by me and considered in my medical decision making (see chart for details).    MDM Rules/Calculators/A&P                           87 year old male presenting to the ED with rectal pain.  He underwent urodynamic testing with urology a few days ago.  States pain is in his anus itself and into the buttocks.  Foley is continued draining as normal.  He has not had any fevers.  Normal bowel movements.  He is afebrile and nontoxic in appearance here.  Foley is present with leg bag, continues draining clear urine.  Rectum is normal in appearance without any swelling, induration, hemorrhoids, or signs of rectal abscess.  Given recent instrumentation, will obtain CT scan.    CT with questionable cellulitis of the scrotum and penis.  No fluid collection, no rectal abscess.  There is scabbed area along head of penis, however this was able to be removed revealing normal, healthy tissue beneath.  There is no induration/cellulitis and no tissue crepitus.  Clinically, this does not appear to represent cellulitis.  I question if his rectal/buttock pain is due to prostatitis from recent instrumentation.  Will start on course of ciprofloxacin for coverage of this.  He has follow-up with urology next week.  Can return here for new concerns.  Shared visit with attending physician, Dr. Betsey Holiday, who agrees with plan of care.  Final  Clinical Impression(s) / ED Diagnoses Final diagnoses:  Post-operative pain  Rectal pain    Rx / DC Orders ED Discharge Orders          Ordered    ciprofloxacin (CIPRO) 500 MG tablet  2 times daily        11/19/20 0242             Larene Pickett, PA-C 11/19/20 0305    Orpah Greek, MD 11/20/20 [phone]

## 2020-11-18 NOTE — ED Notes (Signed)
Patient transported to CT 

## 2020-11-18 NOTE — ED Provider Notes (Signed)
Emergency Medicine Provider Triage Evaluation Note  SHAUGHN BJORKLUND , a 87 y.o. male  was evaluated in triage.  Pt complains of pain after recent procedure.  Patient reports that he recently had a urodynamics study done with Dr. Wendy Poet where they placed a catheter and also placed wires in certain areas to assess problems with his urinary retention.  He reports he is having pain in his buttocks and one of the areas where they inserted the wires and after calling the urology office they were told to come to the ED.  Review of Systems  Positive: Buttock pain Negative: Hematuria, difficulty urinating  Physical Exam  BP (!) 154/79 (BP Location: Right Arm)   Pulse 81   Temp 98.1 F (36.7 C) (Oral)   Resp 18   SpO2 99%  Gen:   Awake, no distress   Resp:  Normal effort  MSK:   Moves extremities without difficulty  Other:  Foley catheter with leg bag in place  Medical Decision Making  Medically screening exam initiated at 5:22 PM.  Appropriate orders placed.  Thompson Caul was informed that the remainder of the evaluation will be completed by another provider, this initial triage assessment does not replace that evaluation, and the importance of remaining in the ED until their evaluation is complete.     Jacqlyn Larsen, PA-C 11/18/20 1724    Merryl Hacker, MD 11/21/20 [phone]

## 2020-11-19 ENCOUNTER — Emergency Department (HOSPITAL_COMMUNITY): Payer: Medicare Other

## 2020-11-19 MED ORDER — CIPROFLOXACIN HCL 500 MG PO TABS: 500.0000 mg | ORAL_TABLET | Freq: Two times a day (BID) | ORAL | 0 refills | Status: DC

## 2020-11-19 NOTE — Discharge Instructions (Addendum)
Take the prescribed medication as directed.  Follow-up with urology next week as scheduled. Return to the ED for new or worsening symptoms.

## 2020-12-20 ENCOUNTER — Ambulatory Visit: Payer: Medicare Other | Admitting: Podiatry

## 2021-01-17 ENCOUNTER — Telehealth: Payer: Self-pay | Admitting: Internal Medicine

## 2021-01-19 ENCOUNTER — Telehealth: Payer: Self-pay

## 2021-01-19 ENCOUNTER — Other Ambulatory Visit: Payer: Self-pay

## 2021-01-19 ENCOUNTER — Encounter: Payer: Self-pay | Admitting: Internal Medicine

## 2021-01-19 ENCOUNTER — Ambulatory Visit: Payer: Medicare Other | Admitting: Internal Medicine

## 2021-01-19 VITALS — BP 126/60 | HR 80 | Ht 72.0 in | Wt 187.2 lb

## 2021-01-19 DIAGNOSIS — N185 Chronic kidney disease, stage 5: Secondary | ICD-10-CM | POA: Diagnosis not present

## 2021-01-19 DIAGNOSIS — Z23 Encounter for immunization: Secondary | ICD-10-CM | POA: Diagnosis not present

## 2021-01-19 DIAGNOSIS — I129 Hypertensive chronic kidney disease with stage 1 through stage 4 chronic kidney disease, or unspecified chronic kidney disease: Secondary | ICD-10-CM | POA: Diagnosis not present

## 2021-01-19 DIAGNOSIS — E118 Type 2 diabetes mellitus with unspecified complications: Secondary | ICD-10-CM

## 2021-01-19 DIAGNOSIS — E538 Deficiency of other specified B group vitamins: Secondary | ICD-10-CM

## 2021-01-19 DIAGNOSIS — E611 Iron deficiency: Secondary | ICD-10-CM | POA: Diagnosis not present

## 2021-01-19 DIAGNOSIS — E785 Hyperlipidemia, unspecified: Secondary | ICD-10-CM | POA: Diagnosis not present

## 2021-01-19 LAB — LIPID PANEL
Cholesterol: 149 mg/dL (ref 0–200)
HDL: 50.5 mg/dL (ref >39.00)
LDL Cholesterol: 85 mg/dL (ref 0–99)
NonHDL: 98.78
Total CHOL/HDL Ratio: 3
Triglycerides: 71 mg/dL (ref 0.0–149.0)
VLDL: 14.2 mg/dL (ref 0.0–40.0)

## 2021-01-19 LAB — CBC WITH DIFFERENTIAL/PLATELET
Basophils Absolute: 0 10*3/uL (ref 0.0–0.1)
Basophils Relative: 0.3 % (ref 0.0–3.0)
Eosinophils Absolute: 0.1 10*3/uL (ref 0.0–0.7)
Eosinophils Relative: 1.4 % (ref 0.0–5.0)
HCT: 36.6 % — ABNORMAL LOW (ref 39.0–52.0)
Hemoglobin: 12.1 g/dL — ABNORMAL LOW (ref 13.0–17.0)
Lymphocytes Relative: 9.3 % — ABNORMAL LOW (ref 12.0–46.0)
Lymphs Abs: 0.5 10*3/uL — ABNORMAL LOW (ref 0.7–4.0)
MCHC: 33.1 g/dL (ref 30.0–36.0)
MCV: 94.8 fl (ref 78.0–100.0)
Monocytes Absolute: 0.6 10*3/uL (ref 0.1–1.0)
Monocytes Relative: 10 % (ref 3.0–12.0)
Neutro Abs: 4.6 10*3/uL (ref 1.4–7.7)
Neutrophils Relative %: 79 % — ABNORMAL HIGH (ref 43.0–77.0)
Platelets: 117 10*3/uL — ABNORMAL LOW (ref 150.0–400.0)
RBC: 3.86 Mil/uL — ABNORMAL LOW (ref 4.22–5.81)
RDW: 13.9 % (ref 11.5–15.5)
WBC: 5.9 10*3/uL (ref 4.0–10.5)

## 2021-01-19 LAB — IBC PANEL
Iron: 92 ug/dL (ref 42–165)
Saturation Ratios: 31.4 % (ref 20.0–50.0)
TIBC: 292.6 ug/dL (ref 250.0–450.0)
Transferrin: 209 mg/dL — ABNORMAL LOW (ref 212.0–360.0)

## 2021-01-19 LAB — BASIC METABOLIC PANEL
BUN: 41 mg/dL — ABNORMAL HIGH (ref 6–23)
CO2: 30 mEq/L (ref 19–32)
Calcium: 9.5 mg/dL (ref 8.4–10.5)
Chloride: 103 mEq/L (ref 96–112)
Creatinine, Ser: 3.81 mg/dL — ABNORMAL HIGH (ref 0.40–1.50)
GFR: 13.35 mL/min — CL (ref >60.00)
Glucose, Bld: 110 mg/dL — ABNORMAL HIGH (ref 70–99)
Potassium: 4.8 mEq/L (ref 3.5–5.1)
Sodium: 139 mEq/L (ref 135–145)

## 2021-01-19 LAB — HEPATIC FUNCTION PANEL
ALT: 26 U/L (ref 0–53)
AST: 24 U/L (ref 0–37)
Albumin: 4.1 g/dL (ref 3.5–5.2)
Alkaline Phosphatase: 90 U/L (ref 39–117)
Bilirubin, Direct: 0.1 mg/dL (ref 0.0–0.3)
Total Bilirubin: 0.5 mg/dL (ref 0.2–1.2)
Total Protein: 7.5 g/dL (ref 6.0–8.3)

## 2021-01-19 LAB — HEMOGLOBIN A1C: Hgb A1c MFr Bld: 6.2 % (ref 4.6–6.5)

## 2021-01-19 LAB — VITAMIN B12: Vitamin B-12: 700 pg/mL (ref 211–911)

## 2021-01-19 LAB — FERRITIN: Ferritin: 378 ng/mL — ABNORMAL HIGH (ref 22.0–322.0)

## 2021-01-19 MED ORDER — SIMVASTATIN 10 MG PO TABS: ORAL_TABLET | ORAL | 3 refills | Status: DC

## 2021-01-19 MED ORDER — SIMVASTATIN 10 MG PO TABS
ORAL_TABLET | ORAL | 3 refills | Status: DC
Start: 2021-01-19 — End: 2022-01-22

## 2021-01-19 NOTE — Telephone Encounter (Signed)
CRITICAL VALUE STICKER  CRITICAL VALUE: GFR 13.35  RECEIVER (on-site recipient of call): [place] D, [place] NOTIFIED: 01/19/2021 at 2:13 pm   MESSENGER (representative from lab): [place]  MD NOTIFIED: Dr. Jenny Reichmann   TIME OF NOTIFICATION: 2:19 pm   RESPONSE:

## 2021-01-19 NOTE — Progress Notes (Signed)
Patient ID: Cory Leonard, male   DOB: 05-Apr-1928, 87 y.o.   MRN: [id number]        Chief Complaint: follow up ckd - 5, HLD and anemia       HPI:  Cory Leonard is a 87 y.o. male here overall doing ok,  Pt denies chest pain, increased sob or doe, wheezing, orthopnea, PND, increased LE swelling, palpitations, dizziness or syncope.   Pt denies polydipsia, polyuria, or new focal neuro s/s.  Sees renal every 3 mo now for near end stage renal disease.  Trying to follow lower chol diet, asks for simvastatin refill.  No overt bleeding, No other new complaints  due for flu shot Wt Readings from Last 3 Encounters:  01/19/21 187 lb 3.2 oz (84.9 kg)  09/21/20 184 lb (83.5 kg)  09/14/20 177 lb 14.6 oz (80.7 kg)   BP Readings from Last 3 Encounters:  01/19/21 126/60  11/19/20 (!) 127/59  09/21/20 134/68         Past Medical History:  Diagnosis Date   Chronic kidney disease    Hyperlipidemia    Hypertension    History reviewed. No pertinent surgical history.  reports that he has never smoked. He has never used smokeless tobacco. He reports that he does not drink alcohol and does not use drugs. Family history is unknown by patient. Allergies  Allergen Reactions   Penicillins Swelling   Amlodipine Swelling   Current Outpatient Medications on File Prior to Visit  Medication Sig Dispense Refill   Calcium Carb-Cholecalciferol (CALCIUM-VITAMIN D) 500-200 MG-UNIT tablet Take 1 tablet by mouth daily.     cyanocobalamin 500 MCG tablet Take 500 mcg by mouth daily.     ketorolac (ACULAR) 0.5 % ophthalmic solution 1 drop daily.     lisinopril-hydrochlorothiazide (ZESTORETIC) 20-25 MG tablet TAKE 1/2 TABLET BY MOUTH DAILY 45 tablet 1   timolol (BETIMOL) 0.5 % ophthalmic solution 1 drop 2 (two) times daily.     alfuzosin (UROXATRAL) 10 MG 24 hr tablet Take 1 tablet (10 mg total) by mouth daily with breakfast. (Patient not taking: Reported on 01/19/2021) 90 tablet 1   dutasteride (AVODART) 0.5 MG capsule  Take 1 capsule (0.5 mg total) by mouth daily. (Patient not taking: Reported on 01/19/2021) 90 capsule 1   No current facility-administered medications on file prior to visit.        ROS:  All others reviewed and negative.  Objective        PE:  BP 126/60 (BP Location: Right Arm, Patient Position: Sitting, Cuff Size: Large)   Pulse 80   Ht 6' (1.829 m)   Wt 187 lb 3.2 oz (84.9 kg)   SpO2 99%   BMI 25.39 kg/m                 Constitutional: Pt appears in NAD               HENT: Head: NCAT.                Right Ear: External ear normal.                 Left Ear: External ear normal.                Eyes: . Pupils are equal, round, and reactive to light. Conjunctivae and EOM are normal               Nose: without d/c or deformity  Neck: Neck supple. Gross normal ROM               Cardiovascular: Normal rate and regular rhythm.                 Pulmonary/Chest: Effort normal and breath sounds without rales or wheezing.                Abd:  Soft, NT, ND, + BS, no organomegaly               Neurological: Pt is alert. At baseline orientation, motor grossly intact               Skin: Skin is warm. No rashes, no other new lesions, LE edema - none               Psychiatric: Pt behavior is normal without agitation   Micro: none  Cardiac tracings I have personally interpreted today:  none  Pertinent Radiological findings (summarize): none   Lab Results  Component Value Date   WBC 5.9 01/19/2021   HGB 12.1 (L) 01/19/2021   HCT 36.6 (L) 01/19/2021   PLT 117.0 (L) 01/19/2021   GLUCOSE 110 (H) 01/19/2021   CHOL 149 01/19/2021   TRIG 71.0 01/19/2021   HDL 50.50 01/19/2021   LDLCALC 85 01/19/2021   ALT 26 01/19/2021   AST 24 01/19/2021   NA 139 01/19/2021   K 4.8 01/19/2021   CL 103 01/19/2021   CREATININE 3.81 (H) 01/19/2021   BUN 41 (H) 01/19/2021   CO2 30 01/19/2021   TSH 2.38 11/26/2018   HGBA1C 6.2 01/19/2021   MICROALBUR <0.7 07/30/2019   Assessment/Plan:   Cory Leonard is a 87 y.o. Black or African American [2] male with  has a past medical history of Chronic kidney disease, Hyperlipidemia, and Hypertension.  Type 2 diabetes mellitus with complication, without long-term current use of insulin ([facility]) Lab Results  Component Value Date   HGBA1C 6.2 01/19/2021   Stable, pt to continue current medical treatment  - diet   Hyperlipidemia LDL goal <100 Lab Results  Component Value Date   LDLCALC 85 01/19/2021   Uncontrolled, goal ldl < 70,, pt to restart current statin zocor   Hypertension, renal disease BP Readings from Last 3 Encounters:  01/19/21 126/60  11/19/20 (!) 127/59  09/21/20 134/68   Stable, pt to continue medical treatment zestoretic   CKD (chronic kidney disease) stage 5, GFR less than 15 ml/min ([facility]) Lab Results  Component Value Date   CREATININE 3.81 (H) 01/19/2021   Stable overall, cont to avoid nephrotoxins, and f/u renal every 3 mo as planned  Followup: Return in about 6 months (around 07/20/2021).  Cathlean Cower, MD 01/25/2021 10:49 AM [facility]

## 2021-01-19 NOTE — Patient Instructions (Addendum)
You had the flu shot today  Please consider having the Shingrix shingle shot done at [facility] if covered by your insurance  You are given the written simvastatin prescription today, and this was also sent to [facility]  Please continue all other medications as before, and refills have been done if requested.  Please have the pharmacy call with any other refills you may need.  Please continue your efforts at being more active, low cholesterol diabetic diet  Please keep your appointments with your specialists as you may have planned - the kidney doctor every 3 months  Please go to the LAB at the blood drawing area for the tests to be done  You will be contacted by phone if any changes need to be made immediately.  Otherwise, you will receive a letter about your results with an explanation, but please check with MyChart first.  Please remember to sign up for MyChart if you have not done so, as this will be important to you in the future with finding out test results, communicating by private email, and scheduling acute appointments online when needed.  Please make an Appointment to return in 6 months, or sooner if needed - to see Dr Ronnald Ramp

## 2021-01-25 ENCOUNTER — Encounter: Payer: Self-pay | Admitting: Internal Medicine

## 2021-01-25 DIAGNOSIS — N185 Chronic kidney disease, stage 5: Secondary | ICD-10-CM | POA: Insufficient documentation

## 2021-01-25 NOTE — Assessment & Plan Note (Signed)
Lab Results  Component Value Date   CREATININE 3.81 (H) 01/19/2021   Stable overall, cont to avoid nephrotoxins, and f/u renal every 3 mo as planned

## 2021-01-25 NOTE — Assessment & Plan Note (Signed)
BP Readings from Last 3 Encounters:  01/19/21 126/60  11/19/20 (!) 127/59  09/21/20 134/68   Stable, pt to continue medical treatment zestoretic

## 2021-01-25 NOTE — Assessment & Plan Note (Signed)
Lab Results  Component Value Date   HGBA1C 6.2 01/19/2021   Stable, pt to continue current medical treatment  - diet

## 2021-01-25 NOTE — Assessment & Plan Note (Signed)
Lab Results  Component Value Date   LDLCALC 85 01/19/2021   Uncontrolled, goal ldl < 70,, pt to restart current statin zocor

## 2021-02-15 ENCOUNTER — Encounter (HOSPITAL_COMMUNITY): Payer: Self-pay | Admitting: Radiology

## 2021-02-21 ENCOUNTER — Other Ambulatory Visit: Payer: Self-pay | Admitting: Internal Medicine

## 2021-02-21 DIAGNOSIS — I129 Hypertensive chronic kidney disease with stage 1 through stage 4 chronic kidney disease, or unspecified chronic kidney disease: Secondary | ICD-10-CM

## 2021-06-02 LAB — BASIC METABOLIC PANEL
BUN: 38 — AB (ref 4–21)
CO2: 23 — AB (ref 13–22)
Chloride: 102 (ref 99–108)
Creatinine: 3.6 — AB (ref 0.6–1.3)
Glucose: 91
Potassium: 4.8 mEq/L (ref 3.5–5.1)
Sodium: 141 (ref 137–147)

## 2021-06-02 LAB — CBC AND DIFFERENTIAL
HCT: 35 — AB (ref 41–53)
Hemoglobin: 11.6 — AB (ref 13.5–17.5)
Neutrophils Absolute: 4
Platelets: 132 10*3/uL — AB (ref 150–400)
WBC: 5.6

## 2021-06-02 LAB — CBC: RBC: 3.72 — AB (ref 3.87–5.11)

## 2021-06-02 LAB — COMPREHENSIVE METABOLIC PANEL
Albumin: 4.3 (ref 3.5–5.0)
Calcium: 9 (ref 8.7–10.7)

## 2021-06-27 ENCOUNTER — Emergency Department (HOSPITAL_COMMUNITY): Payer: Medicare Other

## 2021-06-27 ENCOUNTER — Emergency Department (HOSPITAL_COMMUNITY)
Admission: EM | Admit: 2021-06-27 | Discharge: 2021-06-27 | Disposition: A | Discharge status: Home / Self Care | Payer: Medicare Other | Attending: Emergency Medicine | Admitting: Emergency Medicine

## 2021-06-27 ENCOUNTER — Encounter (HOSPITAL_COMMUNITY): Payer: Self-pay

## 2021-06-27 ENCOUNTER — Other Ambulatory Visit: Payer: Self-pay

## 2021-06-27 DIAGNOSIS — Z79899 Other long term (current) drug therapy: Secondary | ICD-10-CM | POA: Insufficient documentation

## 2021-06-27 DIAGNOSIS — S0990XA Unspecified injury of head, initial encounter: Secondary | ICD-10-CM | POA: Diagnosis present

## 2021-06-27 DIAGNOSIS — W01198A Fall on same level from slipping, tripping and stumbling with subsequent striking against other object, initial encounter: Secondary | ICD-10-CM | POA: Insufficient documentation

## 2021-06-27 DIAGNOSIS — E1122 Type 2 diabetes mellitus with diabetic chronic kidney disease: Secondary | ICD-10-CM | POA: Diagnosis not present

## 2021-06-27 DIAGNOSIS — N189 Chronic kidney disease, unspecified: Secondary | ICD-10-CM | POA: Diagnosis not present

## 2021-06-27 DIAGNOSIS — I129 Hypertensive chronic kidney disease with stage 1 through stage 4 chronic kidney disease, or unspecified chronic kidney disease: Secondary | ICD-10-CM | POA: Insufficient documentation

## 2021-06-27 MED ORDER — ACETAMINOPHEN 325 MG PO TABS
650.0000 mg | ORAL_TABLET | Freq: Once | ORAL | Status: AC
  Administered 2021-06-27: 650 mg via ORAL
  Filled 2021-06-27: qty 2

## 2021-06-27 NOTE — ED Notes (Signed)
ED Provider at bedside. 

## 2021-06-27 NOTE — ED Provider Notes (Signed)
?[facility] DEPT ?Provider Note ? ? ?CSN: [id number] ?Arrival date & time: 06/27/21  1816 ? ?  ? ?History ? ?Chief Complaint  ?Patient presents with  ? Fall  ? Head Injury  ? ? ?[facility] is a 88 y.o. male. ? ?HPI ?Patient presents after a fall this morning at 4 AM.  At that time, he states that he was in a dream.  He fell forward, striking the left side of his face on a carpeted floor.  He initially had a swelling to his left forehead.  The swelling has since resolved and he now has swelling around his left periorbital area.  He endorses a mild headache.  He denies any other areas of pain.  He has been ambulatory since this fall.  He denies use of blood thinners.  Prior to arrival in the ED, patient was seen in urgent care.  He was advised to come to the ED for further evaluation. His medical history includes T2DM, HLD, HTN, glaucoma, BPH, CKD. ?  ? ?Home Medications ?Prior to Admission medications   ?Medication Sig Start Date End Date Taking? Authorizing Provider  ?alfuzosin (UROXATRAL) 10 MG 24 hr tablet Take 1 tablet (10 mg total) by mouth daily with breakfast. ?Patient not taking: Reported on 01/19/2021 09/21/20   Janith Lima, MD  ?Calcium Carb-Cholecalciferol (CALCIUM-VITAMIN D) 500-200 MG-UNIT tablet Take 1 tablet by mouth daily.    [provider]  ?cyanocobalamin 500 MCG tablet Take 500 mcg by mouth daily.    [provider]  ?dutasteride (AVODART) 0.5 MG capsule Take 1 capsule (0.5 mg total) by mouth daily. ?Patient not taking: Reported on 01/19/2021 09/21/20   Janith Lima, MD  ?ketorolac (ACULAR) 0.5 % ophthalmic solution 1 drop daily. 06/05/19   [provider]  ?lisinopril-hydrochlorothiazide (ZESTORETIC) 20-25 MG tablet TAKE 1/2 TABLET BY MOUTH DAILY 02/21/21   Janith Lima, MD  ?simvastatin (ZOCOR) 10 MG tablet 1  tab by mouth once day 01/19/21   Biagio Borg, MD  ?timolol (BETIMOL) 0.5 % ophthalmic solution 1 drop 2 (two) times  daily.    [provider]  ?   ? ?Allergies    ?Penicillins and Amlodipine   ? ?Review of Systems   ?Review of Systems  ?Eyes:   ?     Swelling around left eye  ?Neurological:  Positive for headaches.  ?All other systems reviewed and are negative. ? ?Physical Exam ?Updated Vital Signs ?BP (!) 141/70   Pulse 80   Temp 98.4 ?F (36.9 ?C) (Oral)   Resp 16   Ht 6' (1.829 m)   Wt 84.8 kg   SpO2 96%   BMI 25.36 kg/m?  ?Physical Exam ?Vitals and nursing note reviewed.  ?Constitutional:   ?   General: He is not in acute distress. ?   Appearance: Normal appearance. He is well-developed and normal weight. He is not ill-appearing, toxic-appearing or diaphoretic.  ?HENT:  ?   Head: Normocephalic.  ?   Comments: Mild erythema to left forehead ?   Right Ear: External ear normal.  ?   Left Ear: External ear normal.  ?   Nose: Nose normal.  ?   Mouth/Throat:  ?   Mouth: Mucous membranes are moist.  ?   Pharynx: Oropharynx is clear.  ?Eyes:  ?   Extraocular Movements: Extraocular movements intact.  ?   Conjunctiva/sclera: Conjunctivae normal.  ?   Comments: Left periorbital swelling.  Visual acuity intact  ?  Neck:  ?   Comments: Cervical collar in place ?Cardiovascular:  ?   Rate and Rhythm: Normal rate and regular rhythm.  ?   Heart sounds: No murmur heard. ?Pulmonary:  ?   Effort: Pulmonary effort is normal. No respiratory distress.  ?   Breath sounds: Normal breath sounds. No wheezing or rales.  ?Chest:  ?   Chest wall: No tenderness.  ?Abdominal:  ?   Palpations: Abdomen is soft.  ?   Tenderness: There is no abdominal tenderness.  ?Musculoskeletal:     ?   General: No swelling, tenderness or deformity. Normal range of motion.  ?   Cervical back: No tenderness.  ?Skin: ?   General: Skin is warm and dry.  ?   Capillary Refill: Capillary refill takes less than 2 seconds.  ?   Coloration: Skin is not jaundiced or pale.  ?Neurological:  ?   General: No focal deficit present.  ?   Mental Status: He is alert and oriented  to person, place, and time.  ?   Cranial Nerves: No cranial nerve deficit.  ?   Sensory: No sensory deficit.  ?   Motor: No weakness.  ?   Coordination: Coordination normal.  ?Psychiatric:     ?   Mood and Affect: Mood normal.     ?   Behavior: Behavior normal.     ?   Thought Content: Thought content normal.     ?   Judgment: Judgment normal.  ? ? ?ED Results / Procedures / Treatments   ?Labs ?(all labs ordered are listed, but only abnormal results are displayed) ?Labs Reviewed - No data to display ? ?EKG ?None ? ?Radiology ?CT Head Wo Contrast ? ?Result Date: 06/27/2021 ?CLINICAL DATA:  Head trauma, minor (Age >= 65y) EXAM: CT HEAD WITHOUT CONTRAST TECHNIQUE: Contiguous axial images were obtained from the base of the skull through the vertex without intravenous contrast. RADIATION DOSE REDUCTION: This exam was performed according to the departmental dose-optimization program which includes automated exposure control, adjustment of the mA and/or kV according to patient size and/or use of iterative reconstruction technique. COMPARISON:  None. FINDINGS: Brain: There is atrophy and chronic small vessel disease changes. Old bilateral cerebellar infarcts. No acute intracranial abnormality. Specifically, no hemorrhage, hydrocephalus, mass lesion, acute infarction, or significant intracranial injury. Vascular: No hyperdense vessel or unexpected calcification. Skull: No acute calvarial abnormality. Sinuses/Orbits: No acute findings Other: Soft tissue swelling over the left orbit and forehead. IMPRESSION: Atrophy, chronic microvascular disease. No acute intracranial abnormality. Old bilateral cerebellar infarcts. Electronically Signed   By: Rolm Baptise M.D.   On: 06/27/2021 19:15  ? ?CT Cervical Spine Wo Contrast ? ?Result Date: 06/27/2021 ?CLINICAL DATA:  Neck trauma (Age >= 65y).  Fall. EXAM: CT CERVICAL SPINE WITHOUT CONTRAST TECHNIQUE: Multidetector CT imaging of the cervical spine was performed without intravenous  contrast. Multiplanar CT image reconstructions were also generated. RADIATION DOSE REDUCTION: This exam was performed according to the departmental dose-optimization program which includes automated exposure control, adjustment of the mA and/or kV according to patient size and/or use of iterative reconstruction technique. COMPARISON:  None. FINDINGS: Alignment: Normal Skull base and vertebrae: No acute fracture. No primary bone lesion or focal pathologic process. Soft tissues and spinal canal: No prevertebral fluid or swelling. No visible canal hematoma. Disc levels: Diffuse moderate to advanced degenerative disc disease and facet disease bilaterally. Disc disease is most pronounced at C5-6 and C6-7. Upper chest: Biapical scarring.  No acute findings Other:  None IMPRESSION: No acute bony abnormality. Degenerative disc and facet disease. Electronically Signed   By: Rolm Baptise M.D.   On: 06/27/2021 19:19  ? ?CT Maxillofacial Wo Contrast ? ?Result Date: 06/27/2021 ?CLINICAL DATA:  Fall, facial trauma EXAM: CT MAXILLOFACIAL WITHOUT CONTRAST TECHNIQUE: Multidetector CT imaging of the maxillofacial structures was performed. Multiplanar CT image reconstructions were also generated. RADIATION DOSE REDUCTION: This exam was performed according to the departmental dose-optimization program which includes automated exposure control, adjustment of the mA and/or kV according to patient size and/or use of iterative reconstruction technique. COMPARISON:  None. FINDINGS: Osseous: No fracture or mandibular dislocation. No destructive process. Orbits: Negative. No traumatic or inflammatory finding. Sinuses: No air-fluid levels. Soft tissues: Soft tissue swelling over the left orbit and forehead. Limited intracranial: See head CT report IMPRESSION: No facial or orbital fracture. Electronically Signed   By: Rolm Baptise M.D.   On: 06/27/2021 19:17   ? ?Procedures ?Procedures  ? ? ?Medications Ordered in ED ?Medications  ?acetaminophen  (TYLENOL) tablet 650 mg (650 mg Oral Given 06/27/21 1847)  ? ? ?ED Course/ Medical Decision Making/ A&P ?  ?                        ?Medical Decision Making ?Amount and/or Complexity of Data Reviewed ?Radiology: ordered.

## 2021-06-27 NOTE — ED Triage Notes (Signed)
BIBA ?Per EMS: Pt coming from home w/ c/o fall this morning. Hit head. Pt went to UC and UC advised to go to hospital. L eye swollen shut. Hematoma to head. Denies LOC or blood thinners.  ?150/70 BP  ?80s HR  ?16 RR ? ?

## 2021-06-27 NOTE — ED Triage Notes (Signed)
Pt presents after falling out of a chair.  Swelling and hematoma noted to L eye and L forehead.  Denies pain.  Pt is A&Ox4.  Sts he hit head on carpeted floor.   ? ?EMS placed C Collar.  Pt denies neck pain.   ?

## 2021-08-05 ENCOUNTER — Encounter (HOSPITAL_COMMUNITY): Payer: Self-pay | Admitting: Emergency Medicine

## 2021-08-05 ENCOUNTER — Other Ambulatory Visit: Payer: Self-pay

## 2021-08-05 ENCOUNTER — Emergency Department (HOSPITAL_COMMUNITY)
Admission: EM | Admit: 2021-08-05 | Discharge: 2021-08-05 | Disposition: A | Discharge status: Home / Self Care | Payer: Medicare Other | Attending: Emergency Medicine | Admitting: Emergency Medicine

## 2021-08-05 DIAGNOSIS — X58XXXA Exposure to other specified factors, initial encounter: Secondary | ICD-10-CM | POA: Insufficient documentation

## 2021-08-05 DIAGNOSIS — N189 Chronic kidney disease, unspecified: Secondary | ICD-10-CM | POA: Insufficient documentation

## 2021-08-05 DIAGNOSIS — S80822A Blister (nonthermal), left lower leg, initial encounter: Secondary | ICD-10-CM | POA: Insufficient documentation

## 2021-08-05 DIAGNOSIS — I129 Hypertensive chronic kidney disease with stage 1 through stage 4 chronic kidney disease, or unspecified chronic kidney disease: Secondary | ICD-10-CM | POA: Insufficient documentation

## 2021-08-05 DIAGNOSIS — E1122 Type 2 diabetes mellitus with diabetic chronic kidney disease: Secondary | ICD-10-CM | POA: Diagnosis not present

## 2021-08-05 DIAGNOSIS — S8992XA Unspecified injury of left lower leg, initial encounter: Secondary | ICD-10-CM | POA: Diagnosis present

## 2021-08-05 DIAGNOSIS — Z79899 Other long term (current) drug therapy: Secondary | ICD-10-CM | POA: Insufficient documentation

## 2021-08-05 LAB — CBC WITH DIFFERENTIAL/PLATELET
Abs Immature Granulocytes: 0.02 10*3/uL (ref 0.00–0.07)
Basophils Absolute: 0 10*3/uL (ref 0.0–0.1)
Basophils Relative: 0 %
Eosinophils Absolute: 0.1 10*3/uL (ref 0.0–0.5)
Eosinophils Relative: 2 %
HCT: 35.4 % — ABNORMAL LOW (ref 39.0–52.0)
Hemoglobin: 11.9 g/dL — ABNORMAL LOW (ref 13.0–17.0)
Immature Granulocytes: 0 %
Lymphocytes Relative: 15 %
Lymphs Abs: 0.8 10*3/uL (ref 0.7–4.0)
MCH: 32.5 pg (ref 26.0–34.0)
MCHC: 33.6 g/dL (ref 30.0–36.0)
MCV: 96.7 fL (ref 80.0–100.0)
Monocytes Absolute: 0.7 10*3/uL (ref 0.1–1.0)
Monocytes Relative: 12 %
Neutro Abs: 3.9 10*3/uL (ref 1.7–7.7)
Neutrophils Relative %: 71 %
Platelets: 110 10*3/uL — ABNORMAL LOW (ref 150–400)
RBC: 3.66 MIL/uL — ABNORMAL LOW (ref 4.22–5.81)
RDW: 12.8 % (ref 11.5–15.5)
WBC: 5.6 10*3/uL (ref 4.0–10.5)
nRBC: 0 % (ref 0.0–0.2)

## 2021-08-05 LAB — COMPREHENSIVE METABOLIC PANEL
ALT: 12 U/L (ref 0–44)
AST: 13 U/L — ABNORMAL LOW (ref 15–41)
Albumin: 4 g/dL (ref 3.5–5.0)
Alkaline Phosphatase: 65 U/L (ref 38–126)
Anion gap: 8 (ref 5–15)
BUN: 42 mg/dL — ABNORMAL HIGH (ref 8–23)
CO2: 25 mmol/L (ref 22–32)
Calcium: 9.4 mg/dL (ref 8.9–10.3)
Chloride: 106 mmol/L (ref 98–111)
Creatinine, Ser: 3.84 mg/dL — ABNORMAL HIGH (ref 0.61–1.24)
GFR, Estimated: 14 mL/min — ABNORMAL LOW (ref >60)
Glucose, Bld: 100 mg/dL — ABNORMAL HIGH (ref 70–99)
Potassium: 4.7 mmol/L (ref 3.5–5.1)
Sodium: 139 mmol/L (ref 135–145)
Total Bilirubin: 0.8 mg/dL (ref 0.3–1.2)
Total Protein: 7.3 g/dL (ref 6.5–8.1)

## 2021-08-05 NOTE — Discharge Instructions (Addendum)
You were seen today for blisters of the left leg.  These were wrapped to protect the skin.  I recommend you discuss your situation with urology on Monday to see if they have suggestions for better skin barriers. ?

## 2021-08-05 NOTE — ED Provider Triage Note (Signed)
Emergency Medicine Provider Triage Evaluation Note ? ?C[facility] Leonard , a 88 y.o. male  was evaluated in triage.  Pt complains of blister.  Patient presents the ED with 3 days of a developing blister on his left thigh.  He noticed it approximately 3 days ago and is gradually gotten bigger.  The blister is not painful.  He has 2 areas.  1 areas on his upper thigh and his approximately 2 cm round.  The other area is very small and at the distal thigh.  Is never had this happen before.  He denies any new medications or any change in medications.  He does have a Foley catheter that has had in for about 1 year.  He denies any changes with his urine.  He denies any fevers or chills.  Denies any other concerning symptoms. ? ?Review of Systems  ?Positive: blister ?Negative:  ? ?Physical Exam  ?BP 133/67 (BP Location: Right Arm)   Pulse 64   Temp 98.4 ?F (36.9 ?C) (Oral)   Resp 18   Ht 6' (1.829 m)   Wt 85 kg   SpO2 100%   BMI 25.41 kg/m?  ?Gen:   Awake, no distress   ?Resp:  Normal effort  ?MSK:   Moves extremities without difficulty  ?Other:  He has 2 blistered areas on his left thigh.  The first is on the mid medial left thigh and is approximately 2 cm round.  It appears to be a fluid-filled blister.  No surrounding erythema or tenderness to the touch.  He has another smaller area at the distal thigh.  This is approximately half a centimeter round and appears similar to the lesion above.  He does have a Foley catheter in place and there is no erythema or swelling around this area. ? ? ? ? ? ? ?Medical Decision Making  ?Medically screening exam initiated at 12:48 PM.  Appropriate orders placed.  Cory Leonard was informed that the remainder of the evaluation will be completed by another provider, this initial triage assessment does not replace that evaluation, and the importance of remaining in the ED until their evaluation is complete. ? ?No active concerns for infectious process. He will need further evaluation  once roomed in the back.  ?  ?Adolphus Birchwood, PA-C ?08/05/21 1250 ? ?

## 2021-08-05 NOTE — ED Provider Notes (Signed)
?[facility] DEPT ?Provider Note ? ? ?CSN: [id number] ?Arrival date & time: 08/05/21  1216 ? ?  ? ?History ? ?Chief Complaint  ?Patient presents with  ? blistered area  ? ? ?Cory Leonard is a 87 y.o. male.  Patient presents to the hospital with a chief complaint of blister on the left thigh.  Patient has a Foley catheter.  Patient complains of blister where the Foley catheter tubing rubs against his left medial thigh and also where the straps to the catheter bag rubbed against his thigh.  No other complaints at this time.  Patient denies fever shortness of breath, nausea, vomiting.  Past medical history significant for type 2 diabetes mellitus, hypertension, chronic kidney disease, BPH with urinary obstruction ? ?HPI ? ?  ? ?Home Medications ?Prior to Admission medications   ?Medication Sig Start Date End Date Taking? Authorizing Provider  ?alfuzosin (UROXATRAL) 10 MG 24 hr tablet Take 1 tablet (10 mg total) by mouth daily with breakfast. ?Patient not taking: Reported on 01/19/2021 09/21/20   Janith Lima, MD  ?Calcium Carb-Cholecalciferol (CALCIUM-VITAMIN D) 500-200 MG-UNIT tablet Take 1 tablet by mouth daily.    [provider]  ?cyanocobalamin 500 MCG tablet Take 500 mcg by mouth daily.    [provider]  ?dutasteride (AVODART) 0.5 MG capsule Take 1 capsule (0.5 mg total) by mouth daily. ?Patient not taking: Reported on 01/19/2021 09/21/20   Janith Lima, MD  ?ketorolac (ACULAR) 0.5 % ophthalmic solution 1 drop daily. 06/05/19   [provider]  ?lisinopril-hydrochlorothiazide (ZESTORETIC) 20-25 MG tablet TAKE 1/2 TABLET BY MOUTH DAILY 02/21/21   Janith Lima, MD  ?simvastatin (ZOCOR) 10 MG tablet 1  tab by mouth once day 01/19/21   Biagio Borg, MD  ?timolol (BETIMOL) 0.5 % ophthalmic solution 1 drop 2 (two) times daily.    [provider]  ?   ? ?Allergies    ?Penicillins and Amlodipine   ? ?Review of Systems   ?Review of Systems   ?Constitutional:  Negative for fever.  ?Skin:  Positive for wound.  ? ?Physical Exam ?Updated Vital Signs ?BP 133/67 (BP Location: Right Arm)   Pulse 64   Temp 98.4 ?F (36.9 ?C) (Oral)   Resp 18   Ht 6' (1.829 m)   Wt 85 kg   SpO2 100%   BMI 25.41 kg/m?  ?Physical Exam ?Vitals and nursing note reviewed.  ?Constitutional:   ?   General: He is not in acute distress. ?   Appearance: He is normal weight.  ?HENT:  ?   Head: Normocephalic.  ?Eyes:  ?   Conjunctiva/sclera: Conjunctivae normal.  ?Cardiovascular:  ?   Rate and Rhythm: Normal rate.  ?   Pulses: Normal pulses.  ?Pulmonary:  ?   Effort: Pulmonary effort is normal.  ?Musculoskeletal:  ?   Cervical back: Normal range of motion.  ?Skin: ?   Findings: Lesion present.  ?   Comments: 3 cm blister noted to medial left thigh approximately midway between groin and knee.  0.5 cm blister noticed anteromedial left thigh just proximal to the knee.  No erythema around lesions.  ?Neurological:  ?   Mental Status: He is alert.  ? ? ?ED Results / Procedures / Treatments   ?Labs ?(all labs ordered are listed, but only abnormal results are displayed) ?Labs Reviewed  ?COMPREHENSIVE METABOLIC PANEL - Abnormal; Notable for the following components:  ?    Result Value  ? Glucose, Bld  100 (*)   ? BUN 42 (*)   ? Creatinine, Ser 3.84 (*)   ? AST 13 (*)   ? GFR, Estimated 14 (*)   ? All other components within normal limits  ?CBC WITH DIFFERENTIAL/PLATELET - Abnormal; Notable for the following components:  ? RBC 3.66 (*)   ? Hemoglobin 11.9 (*)   ? HCT 35.4 (*)   ? Platelets 110 (*)   ? All other components within normal limits  ? ? ?EKG ?None ? ?Radiology ?No results found. ? ?Procedures ?Procedures  ? ? ?Medications Ordered in ED ?Medications - No data to display ? ?ED Course/ Medical Decision Making/ A&P ?  ?                        ?Medical Decision Making ? ?This patient presents to the ED for concern of blisters on the leg, this involves an extensive number of treatment  options, and is a complaint that carries with it a high risk of complications and morbidity.  ? ? ?Co morbidities that complicate the patient evaluation ? ?Long-term Foley catheter use ? ? ?Additional history obtained: ? ?Additional history obtained from patient's relative in room ? ?Lab Tests: ? ?I Ordered, and personally interpreted labs.  The pertinent results include: Creatinine 3.84, consistent with previous results.  Hemoglobin 11.9, consistent with previous results ? ? ?Test / Admission - Considered: ? ?The patient has blisters on the left leg from where the Foley catheter tubing and the Foley catheter straps have been rubbing against his skin.  We wrapped his skin today with gauze to protect it.  I believe he should follow-up with urology first of the week to discuss potential dressing or change to protect the skin.  I discussed with the patient and he agrees with the plan.  Discharge home ? ?Final Clinical Impression(s) / ED Diagnoses ?Final diagnoses:  ?Blister of left leg without infection, initial encounter  ? ? ?Rx / DC Orders ?ED Discharge Orders   ? ? None  ? ?  ? ? ?  ?Dorothyann Peng, PA-C ?08/05/21 1540 ? ?  ?Isla Pence, MD ?08/05/21 1622 ? ?

## 2021-08-05 NOTE — ED Triage Notes (Signed)
Patient presents for a complaint of a blistered area to his L thigh, around his urinary catheter area. Denies recent fevers, abdominal pain, flank pain, hematuria, or pain at the blister area.  ?

## 2021-08-13 ENCOUNTER — Other Ambulatory Visit: Payer: Self-pay

## 2021-08-13 ENCOUNTER — Encounter (HOSPITAL_COMMUNITY): Payer: Self-pay

## 2021-08-13 ENCOUNTER — Emergency Department (HOSPITAL_COMMUNITY)
Admission: EM | Admit: 2021-08-13 | Discharge: 2021-08-13 | Disposition: A | Discharge status: Home / Self Care | Payer: Medicare Other | Attending: Emergency Medicine | Admitting: Emergency Medicine

## 2021-08-13 DIAGNOSIS — S79922A Unspecified injury of left thigh, initial encounter: Secondary | ICD-10-CM | POA: Diagnosis present

## 2021-08-13 DIAGNOSIS — Z79899 Other long term (current) drug therapy: Secondary | ICD-10-CM | POA: Insufficient documentation

## 2021-08-13 DIAGNOSIS — X58XXXA Exposure to other specified factors, initial encounter: Secondary | ICD-10-CM | POA: Diagnosis not present

## 2021-08-13 DIAGNOSIS — I10 Essential (primary) hypertension: Secondary | ICD-10-CM | POA: Diagnosis not present

## 2021-08-13 DIAGNOSIS — S70322A Blister (nonthermal), left thigh, initial encounter: Secondary | ICD-10-CM

## 2021-08-13 DIAGNOSIS — E119 Type 2 diabetes mellitus without complications: Secondary | ICD-10-CM | POA: Diagnosis not present

## 2021-08-13 MED ORDER — BACITRACIN ZINC 500 UNIT/GM EX OINT
TOPICAL_OINTMENT | CUTANEOUS | Status: AC
  Filled 2021-08-13: qty 0.9

## 2021-08-13 NOTE — ED Triage Notes (Signed)
Patient reports that he had a blister on his left thigh and the blister popped today. Patient has a dressing around the area where the blister was.

## 2021-08-13 NOTE — ED Provider Notes (Signed)
[facility] DEPT Provider Note   CSN: [id number] Arrival date & time: 08/13/21  1336     History  No chief complaint on file.   Cory Leonard is a 88 y.o. male.  The history is provided by the patient and medical records. No language interpreter was used.   88 year old male with significant history of diabetes, renal insufficiency, hypertension, presents for assessment of a ruptured blister.  Patient has a long-term Foley catheter.  Apparently the catheter has rubbed against patient's left thigh and created a blister.  He was seen in the ED on 5/13 to have his blister addressed.  The staff wrapped his skin with gauze to help protected it.  Today the blister has ruptured.  Patient denies worsening pain no fever no other complaint.  Patient would like to find out what needs to be done in regards to this blister.  No abdominal pain no urinar discomfort.  Home Medications Prior to Admission medications   Medication Sig Start Date End Date Taking? Authorizing Provider  alfuzosin (UROXATRAL) 10 MG 24 hr tablet Take 1 tablet (10 mg total) by mouth daily with breakfast. Patient not taking: Reported on 01/19/2021 09/21/20   Janith Lima, MD  Calcium Carb-Cholecalciferol (CALCIUM-VITAMIN D) 500-200 MG-UNIT tablet Take 1 tablet by mouth daily.    [provider]  cyanocobalamin 500 MCG tablet Take 500 mcg by mouth daily.    [provider]  dutasteride (AVODART) 0.5 MG capsule Take 1 capsule (0.5 mg total) by mouth daily. Patient not taking: Reported on 01/19/2021 09/21/20   Janith Lima, MD  ketorolac (ACULAR) 0.5 % ophthalmic solution 1 drop daily. 06/05/19   [provider]  lisinopril-hydrochlorothiazide (ZESTORETIC) 20-25 MG tablet TAKE 1/2 TABLET BY MOUTH DAILY 02/21/21   Janith Lima, MD  simvastatin (ZOCOR) 10 MG tablet 1  tab by mouth once day 01/19/21   Biagio Borg, MD  timolol (BETIMOL) 0.5 % ophthalmic solution 1 drop 2  (two) times daily.    [provider]      Allergies    Penicillins and Amlodipine    Review of Systems   Review of Systems  All other systems reviewed and are negative.  Physical Exam Updated Vital Signs BP 138/66 (BP Location: Right Arm)   Pulse 72   Temp 98.9 F (37.2 C) (Oral)   Resp 16   Ht 6' (1.829 m)   Wt 85 kg   SpO2 99%   BMI 25.41 kg/m  Physical Exam Vitals and nursing note reviewed.  Constitutional:      General: He is not in acute distress.    Appearance: He is well-developed.  HENT:     Head: Atraumatic.  Eyes:     Conjunctiva/sclera: Conjunctivae normal.  Musculoskeletal:     Cervical back: Neck supple.  Skin:    Findings: No rash.     Comments: Left thigh: There is a 3 cm blistered to the mid anterior thigh that has ruptured.  Normal-appearing base, no surrounding erythema or warmth no tenderness to palpation.  Neurological:     Mental Status: He is alert.    ED Results / Procedures / Treatments   Labs (all labs ordered are listed, but only abnormal results are displayed) Labs Reviewed - No data to display  EKG None  Radiology No results found.  Procedures Procedures    Medications Ordered in ED Medications - No data to display  ED Course/ Medical Decision Making/ A&P  Medical Decision Making  BP 138/66 (BP Location: Right Arm)   Pulse 72   Temp 98.9 F (37.2 C) (Oral)   Resp 16   Ht 6' (1.829 m)   Wt 85 kg   SpO2 99%   BMI 25.41 kg/m   2:24 PM Patient developed a blister to his left thigh from the friction rub against his skin by a Foley catheter.  He was seen several days prior for his blister where he was found to be due to friction rub from the tubing of his Foley catheter.  Today, no evidence of infectious blister.  I recommend patient to apply Neosporin to the wound twice daily for the next several days to decrease risk of infection and to aid with healing.  Patient I will also request  our staff to apply gauze around the Foley catheter tubing to decrease risk of friction skin injury.  Encourage patient to follow-up with urologist for further assessment and management of his Foley.  Return precaution given.  At this time I have low suspicion for cellulitis or abscess causing his symptoms.  I also have low suspicion for herpetic rash.        Final Clinical Impression(s) / ED Diagnoses Final diagnoses:  Blister of left thigh without infection, initial encounter    Rx / DC Orders ED Discharge Orders     None         Domenic Moras, PA-C 08/13/21 1429    Lucrezia Starch, MD 08/13/21 [phone]

## 2021-08-13 NOTE — Discharge Instructions (Signed)
Please apply Neosporin over your blisters twice daily for the next 5 to 7 days to decrease risk of infection.  Keep tubing from the Foley away from the blister.  Follow-up closely with your urologist for further management of your condition.

## 2021-08-23 ENCOUNTER — Other Ambulatory Visit: Payer: Self-pay | Admitting: Internal Medicine

## 2021-08-23 DIAGNOSIS — I129 Hypertensive chronic kidney disease with stage 1 through stage 4 chronic kidney disease, or unspecified chronic kidney disease: Secondary | ICD-10-CM

## 2021-09-19 ENCOUNTER — Telehealth: Payer: Self-pay | Admitting: Internal Medicine

## 2021-09-19 ENCOUNTER — Ambulatory Visit: Payer: Medicare Other | Admitting: Podiatry

## 2021-11-07 ENCOUNTER — Encounter: Payer: Self-pay | Admitting: Internal Medicine

## 2021-11-07 ENCOUNTER — Ambulatory Visit: Payer: Medicare Other | Admitting: Internal Medicine

## 2021-11-07 VITALS — BP 146/70 | HR 68 | Temp 98.3°F | Resp 16 | Ht 72.0 in | Wt 190.0 lb

## 2021-11-07 DIAGNOSIS — Z515 Encounter for palliative care: Secondary | ICD-10-CM | POA: Diagnosis not present

## 2021-11-07 DIAGNOSIS — M170 Bilateral primary osteoarthritis of knee: Secondary | ICD-10-CM

## 2021-11-07 DIAGNOSIS — E785 Hyperlipidemia, unspecified: Secondary | ICD-10-CM | POA: Diagnosis not present

## 2021-11-07 DIAGNOSIS — I129 Hypertensive chronic kidney disease with stage 1 through stage 4 chronic kidney disease, or unspecified chronic kidney disease: Secondary | ICD-10-CM

## 2021-11-07 DIAGNOSIS — Z23 Encounter for immunization: Secondary | ICD-10-CM

## 2021-11-07 DIAGNOSIS — E118 Type 2 diabetes mellitus with unspecified complications: Secondary | ICD-10-CM

## 2021-11-07 DIAGNOSIS — Z0001 Encounter for general adult medical examination with abnormal findings: Secondary | ICD-10-CM

## 2021-11-07 LAB — HEMOGLOBIN A1C: Hgb A1c MFr Bld: 6.3 % (ref 4.6–6.5)

## 2021-11-07 LAB — TSH: TSH: 2.61 u[IU]/mL (ref 0.35–5.50)

## 2021-11-07 MED ORDER — SHINGRIX 50 MCG/0.5ML IM SUSR: 0.5000 mL | Freq: Once | INTRAMUSCULAR | 1 refills | Status: AC

## 2021-11-07 MED ORDER — OXYCODONE HCL 5 MG PO TABS: 5.0000 mg | ORAL_TABLET | Freq: Three times a day (TID) | ORAL | 0 refills | Status: DC | PRN

## 2021-11-07 NOTE — Progress Notes (Unsigned)
Subjective:  Patient ID: Cory Leonard, male    DOB: [birth year]  Age: 88 y.o. MRN: [id number]  CC: No chief complaint on file.   HPI [name] presents for ***  Outpatient Medications Prior to Visit  Medication Sig Dispense Refill   alfuzosin (UROXATRAL) 10 MG 24 hr tablet Take 1 tablet (10 mg total) by mouth daily with breakfast. 90 tablet 1   Calcium Carb-Cholecalciferol (CALCIUM-VITAMIN D) 500-200 MG-UNIT tablet Take 1 tablet by mouth daily.     cyanocobalamin 500 MCG tablet Take 500 mcg by mouth daily.     dutasteride (AVODART) 0.5 MG capsule Take 1 capsule (0.5 mg total) by mouth daily. 90 capsule 1   ketorolac (ACULAR) 0.5 % ophthalmic solution 1 drop daily.     lisinopril-hydrochlorothiazide (ZESTORETIC) 20-25 MG tablet TAKE 1/2 TABLET BY MOUTH DAILY 45 tablet 1   simvastatin (ZOCOR) 10 MG tablet 1  tab by mouth once day 90 tablet 3   timolol (BETIMOL) 0.5 % ophthalmic solution 1 drop 2 (two) times daily.     No facility-administered medications prior to visit.    ROS Review of Systems  Objective:  BP (!) 146/70 (BP Location: Left Arm, Patient Position: Sitting, Cuff Size: Large)   Pulse 68   Temp 98.3 F (36.8 C) (Oral)   Ht 6' (1.829 m)   Wt 190 lb (86.2 kg)   SpO2 97%   BMI 25.77 kg/m   BP Readings from Last 3 Encounters:  11/07/21 (!) 146/70  08/13/21 136/60  08/05/21 (!) 165/67    Wt Readings from Last 3 Encounters:  11/07/21 190 lb (86.2 kg)  08/13/21 187 lb 6.3 oz (85 kg)  08/05/21 187 lb 6.3 oz (85 kg)    Physical Exam  Lab Results  Component Value Date   WBC 5.6 08/05/2021   HGB 11.9 (L) 08/05/2021   HCT 35.4 (L) 08/05/2021   PLT 110 (L) 08/05/2021   GLUCOSE 100 (H) 08/05/2021   CHOL 149 01/19/2021   TRIG 71.0 01/19/2021   HDL 50.50 01/19/2021   LDLCALC 85 01/19/2021   ALT 12 08/05/2021   AST 13 (L) 08/05/2021   NA 139 08/05/2021   K 4.7 08/05/2021   CL 106 08/05/2021   CREATININE 3.84 (H) 08/05/2021   BUN 42 (H) 08/05/2021    CO2 25 08/05/2021   TSH 2.38 11/26/2018   HGBA1C 6.2 01/19/2021   MICROALBUR <0.7 07/30/2019    No results found.  Assessment & Plan:   Diagnoses and all orders for this visit:  Type 2 diabetes mellitus with complication, without long-term current use of insulin (HCC) -     Hemoglobin A1c; Future  Need for shingles vaccine  Hypertension, renal disease -     TSH; Future  Hyperlipidemia LDL goal <100 -     TSH; Future  Encounter for general adult medical examination with abnormal findings  Primary osteoarthritis of both knees -     oxyCODONE (OXY IR/ROXICODONE) 5 MG immediate release tablet; Take 1 tablet (5 mg total) by mouth every 8 (eight) hours as needed for severe pain.  Encounter for palliative care involving management of pain -     oxyCODONE (OXY IR/ROXICODONE) 5 MG immediate release tablet; Take 1 tablet (5 mg total) by mouth every 8 (eight) hours as needed for severe pain.  Need for prophylactic vaccination and inoculation against varicella -     Zoster Vaccine Adjuvanted [facility]) injection; Inject 0.5 mLs into the muscle once for 1 dose.  I am having Cory Leonard start on oxyCODONE and Shingrix. I am also having him maintain his calcium-vitamin D, cyanocobalamin, timolol, ketorolac, dutasteride, alfuzosin, simvastatin, and lisinopril-hydrochlorothiazide.  Meds ordered this encounter  Medications   oxyCODONE (OXY IR/ROXICODONE) 5 MG immediate release tablet    Sig: Take 1 tablet (5 mg total) by mouth every 8 (eight) hours as needed for severe pain.    Dispense:  90 tablet    Refill:  0   Zoster Vaccine Adjuvanted [facility]) injection    Sig: Inject 0.5 mLs into the muscle once for 1 dose.    Dispense:  0.5 mL    Refill:  1     Follow-up: Return in about 6 months (around 05/10/2022).  Scarlette Calico, MD

## 2021-11-07 NOTE — Patient Instructions (Signed)
Health Maintenance, Male Adopting a healthy lifestyle and getting preventive care are important in promoting health and wellness. Ask your health care provider about: The right schedule for you to have regular tests and exams. Things you can do on your own to prevent diseases and keep yourself healthy. What should I know about diet, weight, and exercise? Eat a healthy diet  Eat a diet that includes plenty of vegetables, fruits, low-fat dairy products, and lean protein. Do not eat a lot of foods that are high in solid fats, added sugars, or sodium. Maintain a healthy weight Body mass index (BMI) is a measurement that can be used to identify possible weight problems. It estimates body fat based on height and weight. Your health care provider can help determine your BMI and help you achieve or maintain a healthy weight. Get regular exercise Get regular exercise. This is one of the most important things you can do for your health. Most adults should: Exercise for at least 150 minutes each week. The exercise should increase your heart rate and make you sweat (moderate-intensity exercise). Do strengthening exercises at least twice a week. This is in addition to the moderate-intensity exercise. Spend less time sitting. Even light physical activity can be beneficial. Watch cholesterol and blood lipids Have your blood tested for lipids and cholesterol at 86 years of age, then have this test every 5 years. You may need to have your cholesterol levels checked more often if: Your lipid or cholesterol levels are high. You are older than 86 years of age. You are at high risk for heart disease. What should I know about cancer screening? Many types of cancers can be detected early and may often be prevented. Depending on your health history and family history, you may need to have cancer screening at various ages. This may include screening for: Colorectal cancer. Prostate cancer. Skin cancer. Lung  cancer. What should I know about heart disease, diabetes, and high blood pressure? Blood pressure and heart disease High blood pressure causes heart disease and increases the risk of stroke. This is more likely to develop in people who have high blood pressure readings or are overweight. Talk with your health care provider about your target blood pressure readings. Have your blood pressure checked: Every 3-5 years if you are 18-39 years of age. Every year if you are 40 years old or older. If you are between the ages of 65 and 75 and are a current or former smoker, ask your health care provider if you should have a one-time screening for abdominal aortic aneurysm (AAA). Diabetes Have regular diabetes screenings. This checks your fasting blood sugar level. Have the screening done: Once every three years after age 45 if you are at a normal weight and have a low risk for diabetes. More often and at a younger age if you are overweight or have a high risk for diabetes. What should I know about preventing infection? Hepatitis B If you have a higher risk for hepatitis B, you should be screened for this virus. Talk with your health care provider to find out if you are at risk for hepatitis B infection. Hepatitis C Blood testing is recommended for: Everyone born from 1945 through 1965. Anyone with known risk factors for hepatitis C. Sexually transmitted infections (STIs) You should be screened each year for STIs, including gonorrhea and chlamydia, if: You are sexually active and are younger than 86 years of age. You are older than 86 years of age and your   health care provider tells you that you are at risk for this type of infection. Your sexual activity has changed since you were last screened, and you are at increased risk for chlamydia or gonorrhea. Ask your health care provider if you are at risk. Ask your health care provider about whether you are at high risk for HIV. Your health care provider  may recommend a prescription medicine to help prevent HIV infection. If you choose to take medicine to prevent HIV, you should first get tested for HIV. You should then be tested every 3 months for as long as you are taking the medicine. Follow these instructions at home: Alcohol use Do not drink alcohol if your health care provider tells you not to drink. If you drink alcohol: Limit how much you have to 0-2 drinks a day. Know how much alcohol is in your drink. In the [place] one drink equals one 12 oz bottle of beer (355 mL), one 5 oz glass of wine (148 mL), or one 1 oz glass of hard liquor (44 mL). Lifestyle Do not use any products that contain nicotine or tobacco. These products include cigarettes, chewing tobacco, and vaping devices, such as e-cigarettes. If you need help quitting, ask your health care provider. Do not use street drugs. Do not share needles. Ask your health care provider for help if you need support or information about quitting drugs. General instructions Schedule regular health, dental, and eye exams. Stay current with your vaccines. Tell your health care provider if: You often feel depressed. You have ever been abused or do not feel safe at home. Summary Adopting a healthy lifestyle and getting preventive care are important in promoting health and wellness. Follow your health care provider's instructions about healthy diet, exercising, and getting tested or screened for diseases. Follow your health care provider's instructions on monitoring your cholesterol and blood pressure. This information is not intended to replace advice given to you by your health care provider. Make sure you discuss any questions you have with your health care provider. Document Revised: 08/01/2020 Document Reviewed: 08/01/2020 Elsevier Patient Education  2023 [employer]vier Inc.  

## 2021-11-08 ENCOUNTER — Encounter: Payer: Self-pay | Admitting: Internal Medicine

## 2021-12-06 LAB — BASIC METABOLIC PANEL
BUN: 11 (ref 4–21)
CO2: 21 (ref 13–22)
Chloride: 101 (ref 99–108)
Creatinine: 3.5 — AB (ref 0.6–1.3)
Glucose: 96
Potassium: 4.9 mEq/L (ref 3.5–5.1)
Sodium: 136 — AB (ref 137–147)

## 2021-12-06 LAB — IRON,TIBC AND FERRITIN PANEL
%SAT: 38
Ferritin: 275
Iron: 254
TIBC: 254
UIBC: 158

## 2021-12-06 LAB — CBC AND DIFFERENTIAL
HCT: 34 — AB (ref 41–53)
Hemoglobin: 11.6 — AB (ref 13.5–17.5)
Neutrophils Absolute: 3.9
Platelets: 119 10*3/uL — AB (ref 150–400)
WBC: 5.7

## 2021-12-06 LAB — COMPREHENSIVE METABOLIC PANEL
Albumin: 4.3 (ref 3.5–5.0)
Calcium: 9.4 (ref 8.7–10.7)
eGFR: 16

## 2021-12-06 LAB — VITAMIN D 25 HYDROXY (VIT D DEFICIENCY, FRACTURES): Vit D, 25-Hydroxy: 37.8

## 2021-12-06 LAB — CBC: RBC: 3.55 — AB (ref 3.87–5.11)

## 2022-01-20 ENCOUNTER — Other Ambulatory Visit: Payer: Self-pay | Admitting: Internal Medicine

## 2022-01-20 DIAGNOSIS — E785 Hyperlipidemia, unspecified: Secondary | ICD-10-CM

## 2022-01-20 NOTE — Telephone Encounter (Signed)
Please refill as per office routine med refill policy (all routine meds to be refilled for 3 mo or monthly (per pt preference) up to one year from last visit, then month to month grace period for 3 mo, then further med refills will have to be denied) ? ?

## 2022-02-21 ENCOUNTER — Other Ambulatory Visit: Payer: Self-pay | Admitting: Internal Medicine

## 2022-02-21 DIAGNOSIS — I129 Hypertensive chronic kidney disease with stage 1 through stage 4 chronic kidney disease, or unspecified chronic kidney disease: Secondary | ICD-10-CM

## 2022-02-27 LAB — BASIC METABOLIC PANEL
BUN: 41 — AB (ref 4–21)
CO2: 24 — AB (ref 13–22)
Chloride: 102 (ref 99–108)
Creatinine: 3.7 — AB (ref 0.6–1.3)
Glucose: 103
Potassium: 5 mEq/L (ref 3.5–5.1)
Sodium: 139 (ref 137–147)

## 2022-02-27 LAB — CBC AND DIFFERENTIAL
HCT: 34 — AB (ref 41–53)
Hemoglobin: 11.4 — AB (ref 13.5–17.5)
Platelets: 117 10*3/uL — AB (ref 150–400)
WBC: 5.4

## 2022-02-27 LAB — IRON,TIBC AND FERRITIN PANEL
Ferritin: 251
Iron: 88

## 2022-02-27 LAB — COMPREHENSIVE METABOLIC PANEL
Albumin: 4.1 (ref 3.5–5.0)
Calcium: 9.1 (ref 8.7–10.7)
eGFR: 15

## 2022-03-05 ENCOUNTER — Telehealth: Payer: Self-pay | Admitting: Internal Medicine

## 2022-03-05 NOTE — Telephone Encounter (Signed)
LVM for pt to rtn my call to schedule AWV with [facility] call back # [phone] 

## 2022-04-27 ENCOUNTER — Telehealth: Payer: Self-pay

## 2022-04-27 NOTE — Telephone Encounter (Signed)
Left message for patient to call back to schedule Medicare Annual Wellness Visit   Last AWV  02/18/18  Please schedule at anytime with [facility] if patient calls the office back.    30 Minutes appointment   Any questions, please call me at [phone]

## 2022-05-10 ENCOUNTER — Ambulatory Visit: Payer: Medicare Other | Admitting: Internal Medicine

## 2022-05-21 ENCOUNTER — Ambulatory Visit: Payer: Medicare Other | Admitting: Internal Medicine

## 2022-05-21 ENCOUNTER — Encounter: Payer: Self-pay | Admitting: Internal Medicine

## 2022-05-21 VITALS — BP 136/70 | HR 86 | Temp 98.4°F | Ht 72.0 in | Wt 188.0 lb

## 2022-05-21 DIAGNOSIS — I129 Hypertensive chronic kidney disease with stage 1 through stage 4 chronic kidney disease, or unspecified chronic kidney disease: Secondary | ICD-10-CM

## 2022-05-21 DIAGNOSIS — E118 Type 2 diabetes mellitus with unspecified complications: Secondary | ICD-10-CM

## 2022-05-21 DIAGNOSIS — D696 Thrombocytopenia, unspecified: Secondary | ICD-10-CM

## 2022-05-21 LAB — HEMOGLOBIN A1C: Hgb A1c MFr Bld: 6.4 % (ref 4.6–6.5)

## 2022-05-21 NOTE — Patient Instructions (Signed)

## 2022-05-21 NOTE — Progress Notes (Signed)
Subjective:  Patient ID: Cory Leonard, male    DOB: 09/21/1928  Age: 89 y.o. MRN: [id number]  CC: Diabetes, Hypertension, and Anemia   HPI Cory Leonard presents for f/up ----   He denies chest pain, shortness of breath, or diaphoresis.  Outpatient Medications Prior to Visit  Medication Sig Dispense Refill   alfuzosin (UROXATRAL) 10 MG 24 hr tablet Take 1 tablet (10 mg total) by mouth daily with breakfast. 90 tablet 1   Calcium Carb-Cholecalciferol (CALCIUM-VITAMIN D) 500-200 MG-UNIT tablet Take 1 tablet by mouth daily.     cyanocobalamin 500 MCG tablet Take 500 mcg by mouth daily.     dutasteride (AVODART) 0.5 MG capsule Take 1 capsule (0.5 mg total) by mouth daily. 90 capsule 1   ketorolac (ACULAR) 0.5 % ophthalmic solution 1 drop daily.     lisinopril-hydrochlorothiazide (ZESTORETIC) 20-25 MG tablet TAKE 1/2 TABLET BY MOUTH DAILY 45 tablet 1   oxyCODONE (OXY IR/ROXICODONE) 5 MG immediate release tablet Take 1 tablet (5 mg total) by mouth every 8 (eight) hours as needed for severe pain. 90 tablet 0   simvastatin (ZOCOR) 10 MG tablet TAKE 1 TABLET BY MOUTH EVERY DAY 90 tablet 3   timolol (BETIMOL) 0.5 % ophthalmic solution 1 drop 2 (two) times daily.     No facility-administered medications prior to visit.    ROS Review of Systems  Constitutional: Negative.  Negative for chills, diaphoresis, fatigue, fever and unexpected weight change.  HENT: Negative.    Eyes: Negative.   Respiratory:  Negative for chest tightness, shortness of breath and wheezing.   Cardiovascular:  Positive for leg swelling. Negative for chest pain and palpitations.  Gastrointestinal:  Negative for abdominal pain, constipation, diarrhea, nausea and vomiting.  Endocrine: Negative.   Genitourinary: Negative.  Negative for difficulty urinating.  Musculoskeletal: Negative.  Negative for arthralgias and myalgias.  Skin: Negative.   Hematological:  Negative for adenopathy. Does not bruise/bleed easily.   Psychiatric/Behavioral: Negative.      Objective:  BP 136/70 (BP Location: Left Arm, Patient Position: Sitting, Cuff Size: Large)   Pulse 86   Temp 98.4 F (36.9 C) (Rectal)   Ht 6' (1.829 m)   Wt 188 lb (85.3 kg)   SpO2 92%   BMI 25.50 kg/m   BP Readings from Last 3 Encounters:  05/21/22 136/70  11/07/21 (!) 146/70  08/13/21 136/60    Wt Readings from Last 3 Encounters:  05/21/22 188 lb (85.3 kg)  11/07/21 190 lb (86.2 kg)  08/13/21 187 lb 6.3 oz (85 kg)    Physical Exam Vitals reviewed.  Constitutional:      Appearance: He is not ill-appearing.  HENT:     Nose: Nose normal.     Mouth/Throat:     Mouth: Mucous membranes are moist.  Eyes:     General: No scleral icterus.    Conjunctiva/sclera: Conjunctivae normal.  Cardiovascular:     Rate and Rhythm: Normal rate and regular rhythm. Occasional Extrasystoles are present.    Heart sounds: No murmur heard.    No gallop.  Pulmonary:     Effort: Pulmonary effort is normal.     Breath sounds: No stridor. No wheezing, rhonchi or rales.  Abdominal:     General: Abdomen is flat.     Palpations: There is no mass.     Tenderness: There is no abdominal tenderness. There is no guarding.     Hernia: No hernia is present.  Musculoskeletal:  Cervical back: Neck supple.     Right lower leg: Edema present.     Left lower leg: Edema present.  Lymphadenopathy:     Cervical: No cervical adenopathy.  Skin:    General: Skin is warm and dry.  Neurological:     General: No focal deficit present.     Mental Status: He is alert.  Psychiatric:        Mood and Affect: Mood normal.        Behavior: Behavior normal.     Lab Results  Component Value Date   WBC 5.4 02/27/2022   HGB 11.4 (A) 02/27/2022   HCT 34 (A) 02/27/2022   PLT 117 (A) 02/27/2022   GLUCOSE 100 (H) 08/05/2021   CHOL 149 01/19/2021   TRIG 71.0 01/19/2021   HDL 50.50 01/19/2021   LDLCALC 85 01/19/2021   ALT 12 08/05/2021   AST 13 (L) 08/05/2021   NA  139 02/27/2022   K 5.0 02/27/2022   CL 102 02/27/2022   CREATININE 3.7 (A) 02/27/2022   BUN 41 (A) 02/27/2022   CO2 24 (A) 02/27/2022   TSH 2.61 11/07/2021   HGBA1C 6.4 05/21/2022   MICROALBUR <0.7 07/30/2019    No results found.  Assessment & Plan:   Cory Leonard was seen today for diabetes, hypertension and anemia.  Diagnoses and all orders for this visit:  Type 2 diabetes mellitus with complication, without long-term current use of insulin ([place])- His blood sugar is well-controlled. -     Hemoglobin A1c; Future -     HM Diabetes Foot Exam -     Hemoglobin A1c  Hypertension, renal disease- His blood pressure is adequately well-controlled.  Thrombocytopenia ([place])- There has been no bleeding or bruising.   I am having Cory Leonard maintain his calcium-vitamin D, cyanocobalamin, timolol, ketorolac, dutasteride, alfuzosin, oxyCODONE, simvastatin, and lisinopril-hydrochlorothiazide.  No orders of the defined types were placed in this encounter.    Follow-up: Return in about 6 months (around 11/19/2022).  Cory Calico, MD

## 2022-06-20 LAB — LAB REPORT - SCANNED: EGFR: 13

## 2022-06-27 ENCOUNTER — Encounter: Payer: Self-pay | Admitting: Nephrology

## 2022-08-20 ENCOUNTER — Other Ambulatory Visit: Payer: Self-pay | Admitting: Internal Medicine

## 2022-08-20 DIAGNOSIS — I129 Hypertensive chronic kidney disease with stage 1 through stage 4 chronic kidney disease, or unspecified chronic kidney disease: Secondary | ICD-10-CM

## 2022-09-20 LAB — LAB REPORT - SCANNED: EGFR: 12

## 2022-09-20 IMAGING — CT CT ABD-PELV W/O CM
2 of 4 series · 15 of 46 positions shown, 17 images · non-contrast
Comparison: None.

CLINICAL DATA: Abdominal pain, diarrhea and burning with urination.
Chronic kidney disease.

EXAM:
CT ABDOMEN AND PELVIS WITHOUT CONTRAST
TECHNIQUE: Multidetector CT imaging of the abdomen and pelvis was performed
following the standard protocol without IV contrast.

[Series 2: axial st · axial · 0.83mm/px · z∈[+1244,+1644]mm · 12 of 90 slices shown, 14 images]
[im 5/90  soft-tissue]
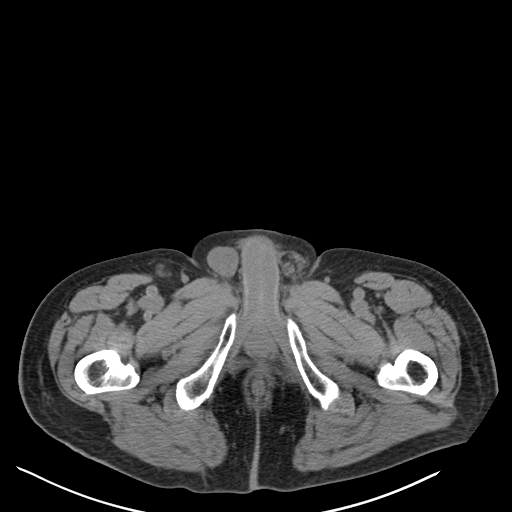
[im 5/90  bone]
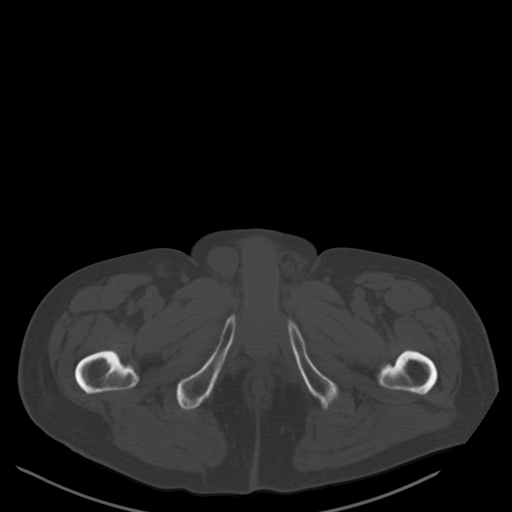
[im 13/90  soft-tissue]
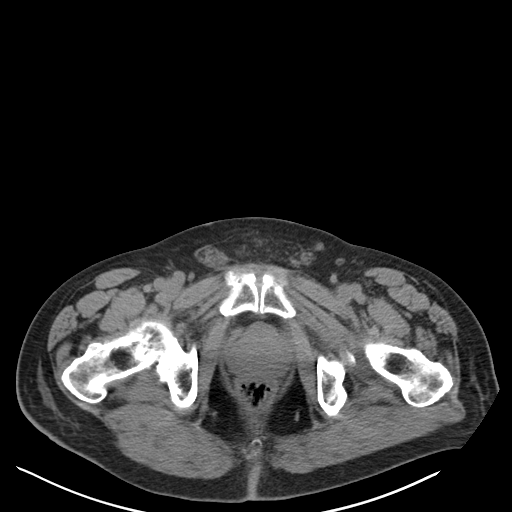
[im 22/90  soft-tissue]
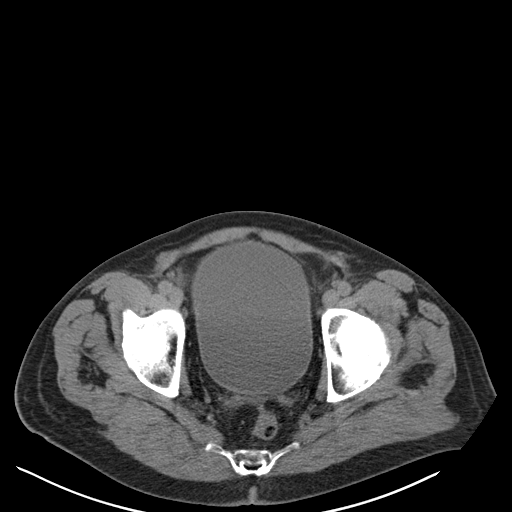
[im 26/90  soft-tissue]
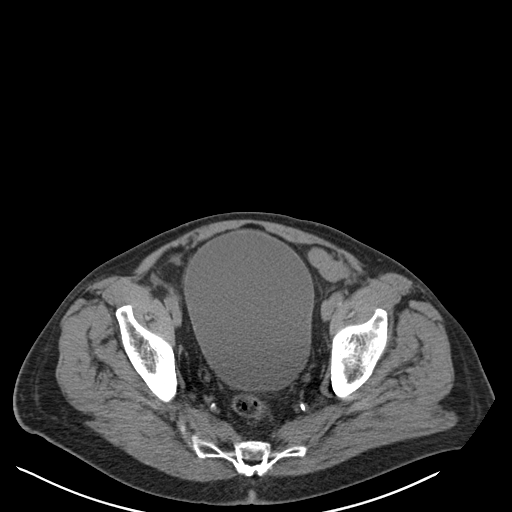
[im 34/90  soft-tissue]
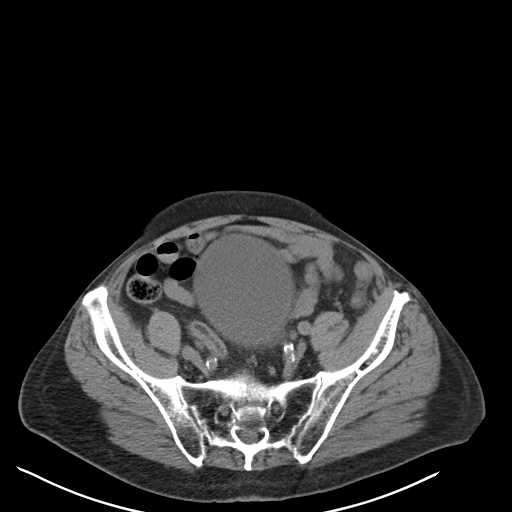
[im 43/90  soft-tissue]
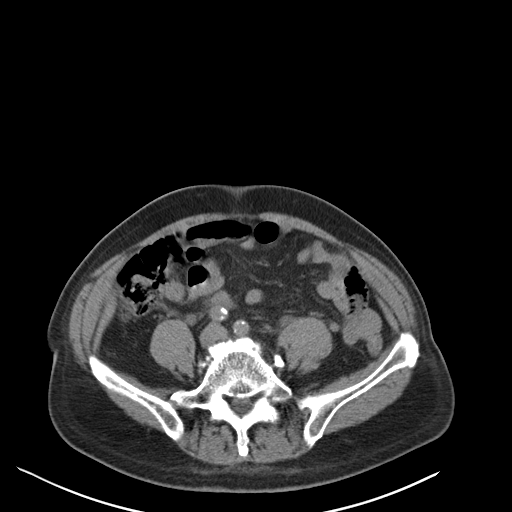
[im 47/90  soft-tissue]
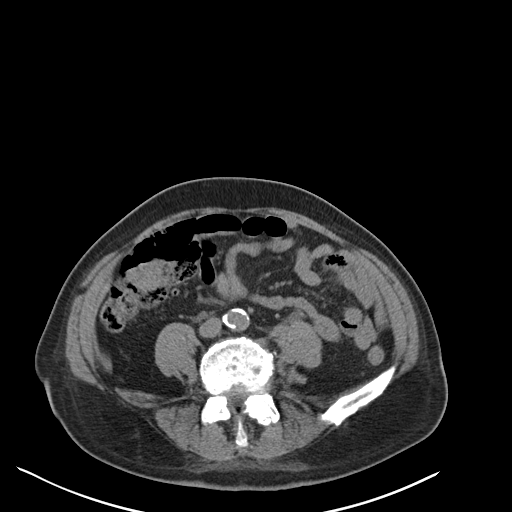
[im 56/90  soft-tissue]
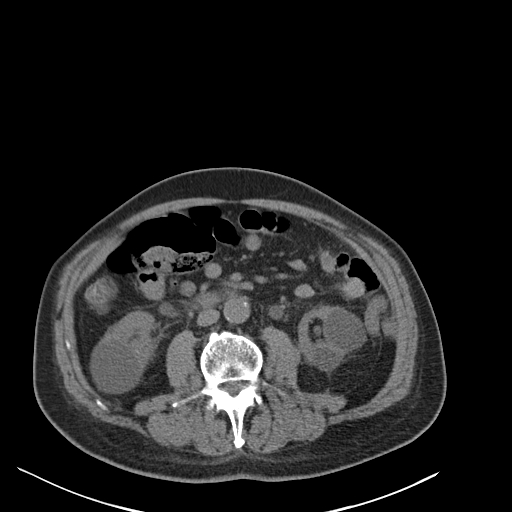
[im 64/90  soft-tissue]
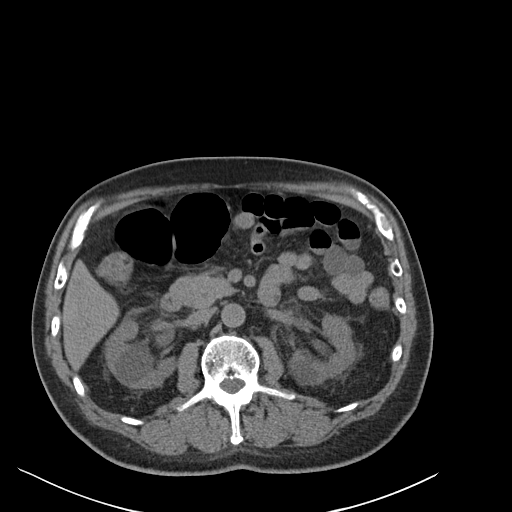
[im 64/90  bone]
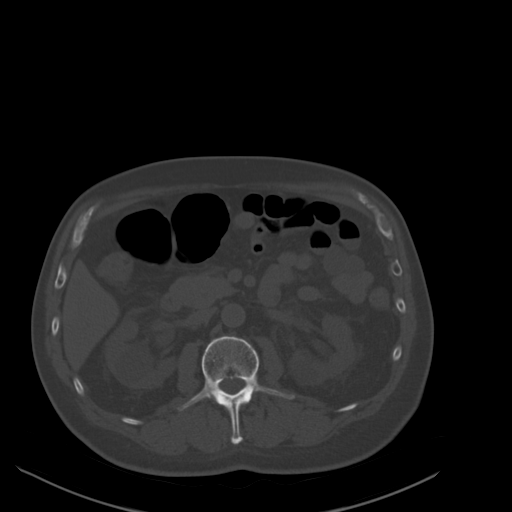
[im 68/90  soft-tissue]
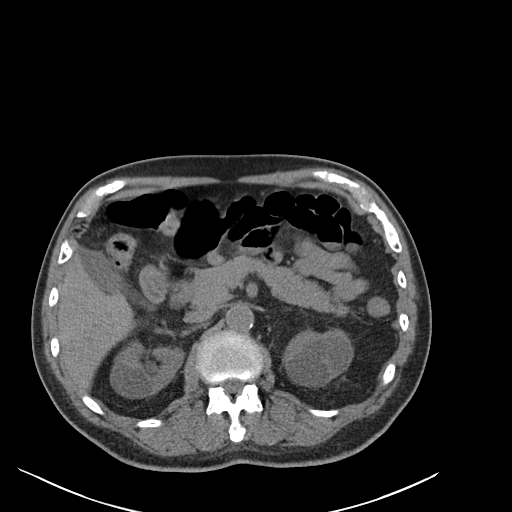
[im 77/90  soft-tissue]
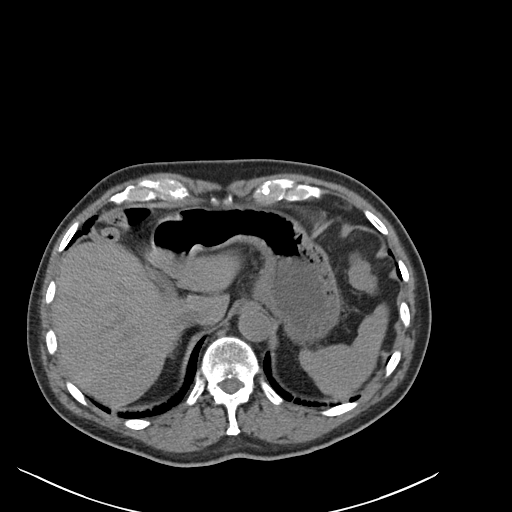
[im 85/90  soft-tissue]
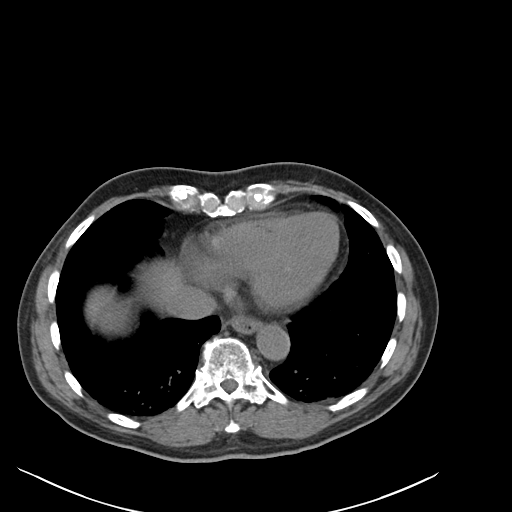

[Series 4: coronal st · coronal · 0.66mm/px · 3 of 151 slices shown]
[im 51/151  soft-tissue]
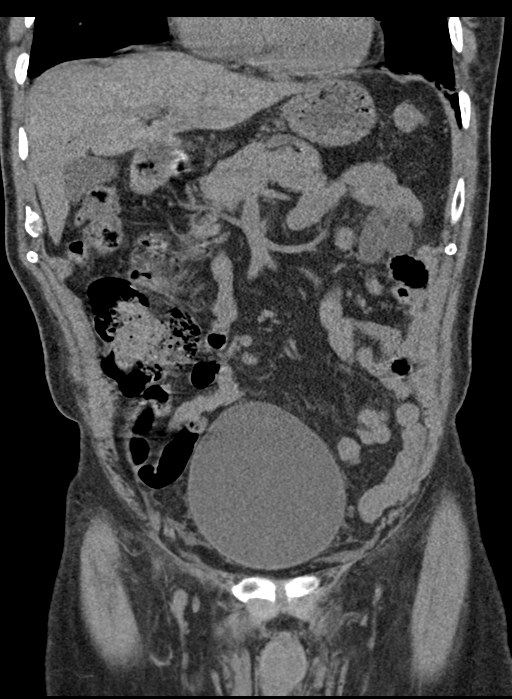
[im 67/151  soft-tissue]
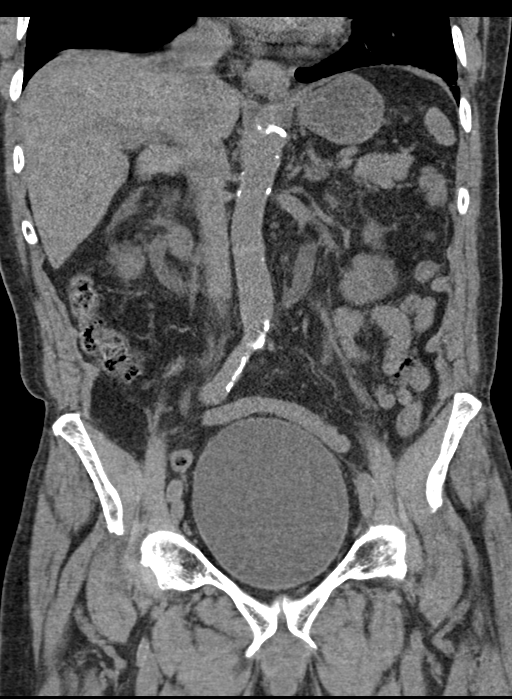
[im 84/151  soft-tissue]
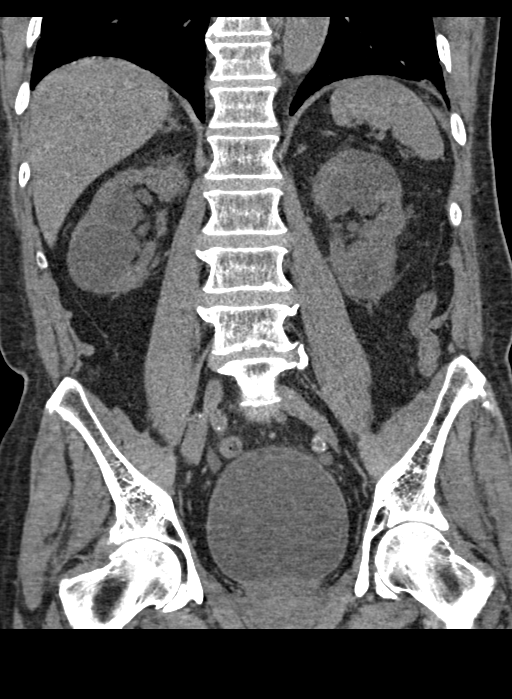

[15 of 46 positions shown; findings below may reference images not displayed]

FINDINGS: Lower chest: Bilateral lower lobe subsegmental atelectasis. Tiny
hiatal hernia.

Hepatobiliary: No focal liver abnormality. No gallstones,
gallbladder wall thickening, or pericholecystic fluid. No biliary
dilatation.

Pancreas: No focal lesion. Normal pancreatic contour. No surrounding
inflammatory changes. No main pancreatic ductal dilatation.

Spleen: Normal in size without focal abnormality.

Adrenals/Urinary Tract:

No adrenal nodule bilaterally.

Bilateral mild hydronephrosis and moderate hydroureter with
associated urothelial thickening and perinephric fat stranding. The
urinary bladder is distended with urine. Multiple fluid density
lesions within the kidneys likely represent simple renal cysts. One
of the cysts is noted to contain a in peripheral calcification
([DATE]). Otherwise no nephroureterolithiasis.

Stomach/Bowel: Stomach is within normal limits. No evidence of bowel
wall thickening or dilatation. Distended cecum with stool. Redundant
sigmoid colon. Few scattered colonic diverticula. Appendix appears
normal.

Vascular/Lymphatic: No abdominal aorta or iliac aneurysm. At least
moderate atherosclerotic plaque of the aorta and its branches. No
abdominal, pelvic, or inguinal lymphadenopathy.

Reproductive: The prostate is enlarged measuring up to 5 cm.

Other: No intraperitoneal free fluid. No intraperitoneal free gas.
No organized fluid collection.

Musculoskeletal:

No abdominal wall hernia or abnormality.

No suspicious lytic or blastic osseous lesions. No acute displaced
fracture. Multilevel degenerative changes of the spine.
IMPRESSION: 1. Urinary bladder distended with urine with bilateral mild to
moderate hydroureteronephrosis. No radiopaque nephroureterolithiasis
bilaterally. This may reflect reflect chronic changes of obstructive
uropathy or reflux. Question urothelial thickening. Correlate with
urinalysis for infection.
2. Cecum distended with stool.  No definite bowel obstruction.
3. Bilateral simple appearing renal cysts. Correlation with prior
cross-sectional imaging would be of value.
4. Prostatomegaly.
5. Tiny hiatal hernia.

## 2022-10-30 ENCOUNTER — Telehealth: Payer: Self-pay | Admitting: Internal Medicine

## 2022-10-30 ENCOUNTER — Ambulatory Visit: Payer: Medicare Other | Admitting: Internal Medicine

## 2022-10-30 NOTE — Telephone Encounter (Signed)
Pt daughter called wanting palliative care for the pt. Pt had appt today pt do not want to come in or do anything. Pt daughter stated she believe that her father is ready to transition. Please call daughter  Consuella Lose) back at [phone].

## 2022-10-30 NOTE — Telephone Encounter (Signed)
Patient's daughter called back and states that it needs to be hospice care.

## 2022-10-31 ENCOUNTER — Emergency Department (HOSPITAL_COMMUNITY)
Admission: EM | Admit: 2022-10-31 | Discharge: 2022-10-31 | Disposition: A | Discharge status: Home / Self Care | Payer: Medicare Other | Attending: Emergency Medicine | Admitting: Emergency Medicine

## 2022-10-31 ENCOUNTER — Other Ambulatory Visit: Payer: Self-pay | Admitting: Internal Medicine

## 2022-10-31 DIAGNOSIS — Z79899 Other long term (current) drug therapy: Secondary | ICD-10-CM | POA: Diagnosis not present

## 2022-10-31 DIAGNOSIS — E860 Dehydration: Secondary | ICD-10-CM | POA: Diagnosis not present

## 2022-10-31 DIAGNOSIS — G301 Alzheimer's disease with late onset: Secondary | ICD-10-CM

## 2022-10-31 DIAGNOSIS — R630 Anorexia: Secondary | ICD-10-CM | POA: Diagnosis present

## 2022-10-31 DIAGNOSIS — N189 Chronic kidney disease, unspecified: Secondary | ICD-10-CM | POA: Diagnosis not present

## 2022-10-31 DIAGNOSIS — R600 Localized edema: Secondary | ICD-10-CM | POA: Diagnosis not present

## 2022-10-31 DIAGNOSIS — I129 Hypertensive chronic kidney disease with stage 1 through stage 4 chronic kidney disease, or unspecified chronic kidney disease: Secondary | ICD-10-CM | POA: Insufficient documentation

## 2022-10-31 DIAGNOSIS — N185 Chronic kidney disease, stage 5: Secondary | ICD-10-CM

## 2022-10-31 LAB — CBC WITH DIFFERENTIAL/PLATELET
Abs Immature Granulocytes: 0.15 10*3/uL — ABNORMAL HIGH (ref 0.00–0.07)
Basophils Absolute: 0 10*3/uL (ref 0.0–0.1)
Basophils Relative: 0 %
Eosinophils Absolute: 0 10*3/uL (ref 0.0–0.5)
Eosinophils Relative: 0 %
HCT: 33.6 % — ABNORMAL LOW (ref 39.0–52.0)
Hemoglobin: 10.8 g/dL — ABNORMAL LOW (ref 13.0–17.0)
Immature Granulocytes: 2 %
Lymphocytes Relative: 7 %
Lymphs Abs: 0.5 10*3/uL — ABNORMAL LOW (ref 0.7–4.0)
MCH: 30.2 pg (ref 26.0–34.0)
MCHC: 32.1 g/dL (ref 30.0–36.0)
MCV: 93.9 fL (ref 80.0–100.0)
Monocytes Absolute: 0.8 10*3/uL (ref 0.1–1.0)
Monocytes Relative: 11 %
Neutro Abs: 5.8 10*3/uL (ref 1.7–7.7)
Neutrophils Relative %: 80 %
Platelets: 142 10*3/uL — ABNORMAL LOW (ref 150–400)
RBC: 3.58 MIL/uL — ABNORMAL LOW (ref 4.22–5.81)
RDW: 12.7 % (ref 11.5–15.5)
WBC: 7.2 10*3/uL (ref 4.0–10.5)
nRBC: 0 % (ref 0.0–0.2)

## 2022-10-31 LAB — URINALYSIS, W/ REFLEX TO CULTURE (INFECTION SUSPECTED)
Bilirubin Urine: NEGATIVE
Glucose, UA: NEGATIVE mg/dL
Ketones, ur: NEGATIVE mg/dL
Nitrite: NEGATIVE
Protein, ur: NEGATIVE mg/dL
Specific Gravity, Urine: 1.006 (ref 1.005–1.030)
pH: 6 (ref 5.0–8.0)

## 2022-10-31 LAB — COMPREHENSIVE METABOLIC PANEL
ALT: 16 U/L (ref 0–44)
AST: 23 U/L (ref 15–41)
Albumin: 3.1 g/dL — ABNORMAL LOW (ref 3.5–5.0)
Alkaline Phosphatase: 270 U/L — ABNORMAL HIGH (ref 38–126)
Anion gap: 15 (ref 5–15)
BUN: 97 mg/dL — ABNORMAL HIGH (ref 8–23)
CO2: 21 mmol/L — ABNORMAL LOW (ref 22–32)
Calcium: 8.9 mg/dL (ref 8.9–10.3)
Chloride: 99 mmol/L (ref 98–111)
Creatinine, Ser: 4.14 mg/dL — ABNORMAL HIGH (ref 0.61–1.24)
GFR, Estimated: 13 mL/min — ABNORMAL LOW (ref >60)
Glucose, Bld: 120 mg/dL — ABNORMAL HIGH (ref 70–99)
Potassium: 4.1 mmol/L (ref 3.5–5.1)
Sodium: 135 mmol/L (ref 135–145)
Total Bilirubin: 0.7 mg/dL (ref 0.3–1.2)
Total Protein: 7.2 g/dL (ref 6.5–8.1)

## 2022-10-31 MED ORDER — SODIUM CHLORIDE 0.9 % IV BOLUS
500.0000 mL | Freq: Once | INTRAVENOUS | Status: AC
  Administered 2022-10-31: 500 mL via INTRAVENOUS

## 2022-10-31 NOTE — ED Triage Notes (Signed)
Pt arrived via [facility] from home for lack of appetite. Pt has been stationary lately with lack of appetite. He also sleeps more.   BP 145/63 CBG 140 O2 97%RA  HR 78

## 2022-10-31 NOTE — ED Provider Notes (Signed)
[place] EMERGENCY DEPARTMENT AT [facility] Provider Note   CSN: [id number] Arrival date & time: 10/31/22  1006     History  Chief Complaint  Patient presents with   lack of appetite    Cory Leonard is a 89 y.o. male.  HPI Patient presents with reported lack of appetite.  Patient states he just has not had much of an appetite.  However reviewing notes it appears that family is attempting get patient in hospice.  When asked about this patient states that his wife and daughter are taking care of it.  Denies fevers.   Past Medical History:  Diagnosis Date   Chronic kidney disease    Hyperlipidemia    Hypertension     Home Medications Prior to Admission medications   Medication Sig Start Date End Date Taking? Authorizing Provider  Calcium Carb-Cholecalciferol (CALCIUM-VITAMIN D) 500-200 MG-UNIT tablet Take 1 tablet by mouth daily.   Yes [provider]  cyanocobalamin 500 MCG tablet Take 500 mcg by mouth daily.   Yes [provider]  furosemide (LASIX) 40 MG tablet Take 40 mg by mouth every morning. 09/03/22  Yes [provider]  lisinopril-hydrochlorothiazide (ZESTORETIC) 20-25 MG tablet TAKE 1/2 TABLET BY MOUTH DAILY Patient taking differently: Take 0.5 tablets by mouth daily. 08/20/22  Yes Etta Grandchild, MD  oxyCODONE (OXY IR/ROXICODONE) 5 MG immediate release tablet Take 1 tablet (5 mg total) by mouth every 8 (eight) hours as needed for severe pain. 11/07/21  Yes Etta Grandchild, MD  simvastatin (ZOCOR) 10 MG tablet TAKE 1 TABLET BY MOUTH EVERY DAY Patient taking differently: Take 10 mg by mouth daily. 01/22/22  Yes Corwin Levins, MD  Timolol Maleate, Once-Daily, 0.5 % SOLN Apply 1 drop to eye every morning. 10/11/22  Yes [provider]      Allergies    Penicillins and Amlodipine    Review of Systems   Review of Systems  Physical Exam Updated Vital Signs BP 131/85 (BP Location: Right Arm)   Pulse 91   Temp 98.3 F  (36.8 C) (Oral)   Resp 16   SpO2 100%  Physical Exam Vitals and nursing note reviewed.  HENT:     Head: Normocephalic.  Cardiovascular:     Rate and Rhythm: Regular rhythm.  Pulmonary:     Breath sounds: No wheezing.  Chest:     Chest wall: No tenderness.  Musculoskeletal:     Right lower leg: Edema present.     Left lower leg: Edema present.     Comments: Edema/lymphedema bilateral lower extremities.  Neurological:     Mental Status: He is alert.     Comments: Awake and pleasant.  Answers questions.   Foley catheter in place  ED Results / Procedures / Treatments   Labs (all labs ordered are listed, but only abnormal results are displayed) Labs Reviewed  COMPREHENSIVE METABOLIC PANEL - Abnormal; Notable for the following components:      Result Value   CO2 21 (*)    Glucose, Bld 120 (*)    BUN 97 (*)    Creatinine, Ser 4.14 (*)    Albumin 3.1 (*)    Alkaline Phosphatase 270 (*)    GFR, Estimated 13 (*)    All other components within normal limits  CBC WITH DIFFERENTIAL/PLATELET - Abnormal; Notable for the following components:   RBC 3.58 (*)    Hemoglobin 10.8 (*)    HCT 33.6 (*)    Platelets 142 (*)  Lymphs Abs 0.5 (*)    Abs Immature Granulocytes 0.15 (*)    All other components within normal limits  URINALYSIS, W/ REFLEX TO CULTURE (INFECTION SUSPECTED) - Abnormal; Notable for the following components:   Hgb urine dipstick MODERATE (*)    Leukocytes,Ua SMALL (*)    Bacteria, UA RARE (*)    All other components within normal limits    EKG None  Radiology No results found.  Procedures Procedures    Medications Ordered in ED Medications  sodium chloride 0.9 % bolus 500 mL (0 mLs Intravenous Stopped 10/31/22 1232)  sodium chloride 0.9 % bolus 500 mL (500 mLs Intravenous New Bag/Given 10/31/22 1307)    ED Course/ Medical Decision Making/ A&P                                 Medical Decision Making Amount and/or Complexity of Data Reviewed Labs:  ordered.   Patient brought in for reported decreased appetite.  Increased oral intake.  Reviewing notes it appears that there was plans for potentially hospice.  Will wait and discussed with family member. Discussed with family member.  They were hoping for palliative care versus hospice, however they do not exactly know what this means.  Discussed about the difference between the 2.  Do not think he is end-of-life right now but has been eating and drinking less.  His goal is to go home.  Sounds like they do not have as much care as needed at home.  Does have some care at home but he reportedly prefers males and staffing may not be able to provide enough for him. He is complaining of some pain in his buttock area.  Will get basic blood work.  Will examine his skin.  Will also check urinalysis. Reportedly has been somewhat more confused.  Has been doing things such as paying his bills up until this point.  Lab work does show some dehydration.  Does not appear to be severe enough to need admission.  Patient is actually eager to go home.  Will discuss with palliative care about potentially more urgent follow-up.  Transition of care has seen patient.  Helping arranging home palliative.  [facility] will not do because he does not have cancer.  Arranging supplies for home also.       Final Clinical Impression(s) / ED Diagnoses Final diagnoses:  Dehydration    Rx / DC Orders ED Discharge Orders     None         Benjiman Core, MD 10/31/22 [phone]

## 2022-10-31 NOTE — Care Management (Addendum)
Transition of Care [facility]) - Emergency Department Mini Assessment   Patient Details  Name: Cory Leonard MRN: [id number] Date of Birth: [birth year]  Transition of Care [facility]) CM/SW Contact:    Lavenia Atlas, RN Phone Number: 10/31/2022, 5:03 PM   Clinical Narrative: Jayme Cloud consult for outpatient palliative services.Per chart review patient's daughter has contacted PCP to get help with hospice services. This RNCM spoke with patient, wife Suzan Garibaldi), daughter Linus Mako) at bedside who reports they would like assistance w/ obtaining outpatient palliative services. Family is also requesting DME to assist patient at home. Patient's daughter reports they have already interviewed [facility] for additional help at home.  This RNCM offered choice for outpatient palliative agencies ([email or web address]). Family chose any agency that will accept patient for services.  This RNCM spoke with [facility]/ [facility] who will follow patient for outpatient palliative services. This RNCM spoke with Ian Malkin w/[employer] to assist with DME: bedside commode, wheelchair, shower chair (pt's daughter is aware of oop cost). [employer] will deliver DME to patient's home. Notified MD, RN. RN to call for [facility].  No additional TOC needs at this time.  ED Mini Assessment: What brought you to the Emergency Department? : Decreased appetite and weakness  Barriers to Discharge: No Barriers Identified  Barrier interventions: Coordinated outpatient palliative services and DME  Means of departure: Ambulance  Interventions which prevented an admission or readmission: Palliative Care, DME Provided    Patient Contact and Communications     Spoke with: Suzan Garibaldi (spouse), Linus Mako (daughter)  ,          Patient states their goals for this hospitalization and ongoing recovery are:: return home safely CMS Medicare.gov Compare Post Acute Care list provided to:: Patient Choice offered to / list presented to :  Patient  Admission diagnosis:  lack of appetite Patient Active Problem List   Diagnosis Date Noted   Encounter for general adult medical examination with abnormal findings 11/07/2021   Encounter for palliative care involving management of pain 11/07/2021   CKD (chronic kidney disease) stage 5, GFR less than 15 ml/min (HCC) 01/25/2021   Benign prostatic hyperplasia with urinary obstruction 09/21/2020   Flu vaccine need 02/02/2020   Need for shingles vaccine 02/02/2020   Thrombocytopenia (HCC) 11/26/2018   Primary osteoarthritis of both knees 11/28/2017   Type 2 diabetes mellitus with complication, without long-term current use of insulin (HCC) 01/09/2017   Hyperlipidemia LDL goal <100 01/09/2017   Hypertension, renal disease 01/09/2017   Open-angle glaucoma of both eyes 01/09/2017   PCP:  Etta Grandchild, MD Pharmacy:   [id number] [place], [place] - [address] AT [facility] [address] [facility] [address] [place] [address] Phone: [phone] Fax: [phone]

## 2022-10-31 NOTE — Progress Notes (Signed)
    Durable Medical Equipment  (From admission, onward)           Start     Ordered   10/31/22 1629  For home use only DME wheelchair cushion (seat and back)  Once        10/31/22 1635   10/31/22 1625  For home use only DME Bedside commode  Once       Question:  Patient needs a bedside commode to treat with the following condition  Answer:  Osteoarthritis of left hip   10/31/22 1635            This patient requires assistance with ambulation and unable to safely ambulate to a restroom in his home near his bedside. Patient requires bed side commode to avoid falls.

## 2022-10-31 NOTE — Discharge Instructions (Addendum)
Talk with Dr. Yetta Barre about potentially adjusting your medicines.  He may need to change your fluid pills since you are not eating as much although you do have fluid on your legs.

## 2022-10-31 NOTE — Telephone Encounter (Signed)
Pt's daughter has called back to follow up on Hospice order request. She was encouraged to make an OV since his last visit was 04/2022. She declined stating she can not get him to an appointment.

## 2022-10-31 NOTE — Telephone Encounter (Signed)
This has been ordered 

## 2022-10-31 NOTE — ED Notes (Signed)
[facility] contacted for ride setup. Reports pt is 20 calls back.

## 2022-10-31 NOTE — Telephone Encounter (Signed)
Pt daughter called back this morning concerning about the pt. Please reach out to daughter.

## 2022-10-31 NOTE — Telephone Encounter (Signed)
Patient's daughter called back to check on the status of her request. She was advised he would need an appointment and she said she could not get him in. She is concerned about him and says it is urgent. She would like a call back at [phone].

## 2022-11-01 ENCOUNTER — Telehealth: Payer: Self-pay | Admitting: Internal Medicine

## 2022-11-01 ENCOUNTER — Telehealth: Payer: Self-pay

## 2022-11-01 NOTE — Telephone Encounter (Signed)
Verbal orders given to Tammy to proceed with palliative care.

## 2022-11-01 NOTE — Telephone Encounter (Signed)
Drenda Freeze called from [facility] to start palliative care.  Best called back [phone]

## 2022-11-01 NOTE — Transitions of Care (Post Inpatient/ED Visit) (Signed)
   11/01/2022  Name: Cory Leonard MRN: [id number] DOB: Dec 25, 1928  Today's TOC FU Call Status: Today's TOC FU Call Status:: Successful TOC FU Call Completed TOC FU Call Complete Date: 11/01/22  Transition Care Management Follow-up Telephone Call Date of Discharge: 10/31/22 Discharge Facility: [facility]) Type of Discharge: Emergency Department Reason for ED Visit: Other: (dehydration) How have you been since you were released from the hospital?: Better  Items Reviewed: Did you receive and understand the discharge instructions provided?: Yes Medications obtained,verified, and reconciled?: Yes (Medications Reviewed) Any new allergies since your discharge?: No Dietary orders reviewed?: Yes Do you have support at home?: Yes People in Home: spouse  Medications Reviewed Today: Medications Reviewed Today     Reviewed by Karena Addison, LPN (Licensed Practical Nurse) on 11/01/22 at 1036  Med List Status: <None>   Medication Order Taking? Sig Documenting Provider Last Dose Status Informant  Calcium Carb-Cholecalciferol (CALCIUM-VITAMIN D) 500-200 MG-UNIT tablet [id number] No Take 1 tablet by mouth daily. [provider] 10/30/2022 Active Self, Spouse/Significant Other, Pharmacy Records  cyanocobalamin 500 MCG tablet [id number] No Take 500 mcg by mouth daily. [provider] 10/30/2022 Active Self, Spouse/Significant Other, Pharmacy Records  furosemide (LASIX) 40 MG tablet [id number] No Take 40 mg by mouth every morning. [provider] 10/30/2022 Active Self, Spouse/Significant Other, Pharmacy Records  lisinopril-hydrochlorothiazide (ZESTORETIC) 20-25 MG tablet [id number] No TAKE 1/2 TABLET BY MOUTH DAILY  Patient taking differently: Take 0.5 tablets by mouth daily.   Etta Grandchild, MD 10/30/2022 Active Self, Spouse/Significant Other, Pharmacy Records  oxyCODONE (OXY IR/ROXICODONE) 5 MG immediate release tablet [id number] No Take 1 tablet (5 mg total) by mouth every 8  (eight) hours as needed for severe pain. Etta Grandchild, MD UNK Active Self, Spouse/Significant Other, Pharmacy Records  simvastatin (ZOCOR) 10 MG tablet [id number] No TAKE 1 TABLET BY MOUTH EVERY DAY  Patient taking differently: Take 10 mg by mouth daily.   Corwin Levins, MD 10/30/2022 Active Self, Spouse/Significant Other, Pharmacy Records  Timolol Maleate, Once-Daily, 0.5 % SOLN [id number] No Apply 1 drop to eye every morning. [provider] 10/30/2022 Active Self, Spouse/Significant Other, Pharmacy Records            Home Care and Equipment/Supplies: Were Home Health Services Ordered?: NA Any new equipment or medical supplies ordered?: NA  Functional Questionnaire: Do you need assistance with bathing/showering or dressing?: No Do you need assistance with meal preparation?: No Do you need assistance with eating?: No Do you have difficulty maintaining continence: No Do you need assistance with getting out of bed/getting out of a chair/moving?: No Do you have difficulty managing or taking your medications?: No  Follow up appointments reviewed: PCP Follow-up appointment confirmed?: Yes Date of PCP follow-up appointment?: 11/05/22 Follow-up Provider: [facility] Follow-up appointment confirmed?: NA Do you need transportation to your follow-up appointment?: No Do you understand care options if your condition(s) worsen?: Yes-patient verbalized understanding    SIGNATURE Karena Addison, LPN [facility] Nurse Health Advisor Direct Dial [phone]

## 2022-11-02 ENCOUNTER — Telehealth: Payer: Self-pay | Admitting: Internal Medicine

## 2022-11-02 DIAGNOSIS — E118 Type 2 diabetes mellitus with unspecified complications: Secondary | ICD-10-CM

## 2022-11-02 DIAGNOSIS — M170 Bilateral primary osteoarthritis of knee: Secondary | ICD-10-CM

## 2022-11-02 DIAGNOSIS — F02C0 Dementia in other diseases classified elsewhere, severe, without behavioral disturbance, psychotic disturbance, mood disturbance, and anxiety: Secondary | ICD-10-CM

## 2022-11-02 NOTE — Telephone Encounter (Signed)
Patient's daughter called to request a hospital bed and a walker for her father to make him comfortable at home. Best callback is [phone].

## 2022-11-02 NOTE — Telephone Encounter (Signed)
Patient's daughter called and said [place] cannot validate patient's insurance. She would like to know if it can be sent to [facility] instead. She said she already spoke with them. They would like for it to be faxed to [phone] ASAP. Best callback for Selena Batten is [phone] Consuella Lose would like a call back when this is sent. Best callback for her is [phone].

## 2022-11-02 NOTE — Telephone Encounter (Signed)
Patients daughter called back and would like for you to call her.  [phone]

## 2022-11-02 NOTE — Telephone Encounter (Signed)
DME orders have been done.   Consuella Lose, pts daughter, requested that orders be faxed to [facility] in [place].  [employer] fax [phone]

## 2022-11-02 NOTE — Telephone Encounter (Signed)
Patient;'s daughter called back and said that [employer] sent another form that they need filled out and faxed back so it can be delivered.  Please call Mrs. Yetta Barre (daughter) at  [phone]

## 2022-11-05 ENCOUNTER — Inpatient Hospital Stay: Payer: Medicare Other | Admitting: Internal Medicine

## 2022-11-05 NOTE — Telephone Encounter (Signed)
Pt's daughter stated that the pt is now under Hospice care and DME orders are no loner needed.

## 2022-11-08 ENCOUNTER — Encounter (INDEPENDENT_AMBULATORY_CARE_PROVIDER_SITE_OTHER): Payer: Self-pay

## 2022-11-08 NOTE — Progress Notes (Signed)
This encounter was created in error - please disregard.  When I called pt's number, his picked up the phone and stated that they had cancelled this appointment and then hung up the phone-BT

## 2022-11-18 ENCOUNTER — Other Ambulatory Visit: Payer: Self-pay | Admitting: Internal Medicine

## 2022-11-18 DIAGNOSIS — I129 Hypertensive chronic kidney disease with stage 1 through stage 4 chronic kidney disease, or unspecified chronic kidney disease: Secondary | ICD-10-CM

## 2022-11-19 ENCOUNTER — Ambulatory Visit: Payer: Medicare Other | Admitting: Internal Medicine

## 2022-11-23 ENCOUNTER — Other Ambulatory Visit: Payer: Self-pay

## 2022-11-23 ENCOUNTER — Emergency Department (HOSPITAL_COMMUNITY)
Admission: EM | Admit: 2022-11-23 | Discharge: 2022-11-25 | Disposition: A | Discharge status: Home / Self Care | Attending: Emergency Medicine | Admitting: Emergency Medicine

## 2022-11-23 DIAGNOSIS — N189 Chronic kidney disease, unspecified: Secondary | ICD-10-CM | POA: Diagnosis not present

## 2022-11-23 DIAGNOSIS — R627 Adult failure to thrive: Secondary | ICD-10-CM | POA: Insufficient documentation

## 2022-11-23 DIAGNOSIS — Z79899 Other long term (current) drug therapy: Secondary | ICD-10-CM | POA: Insufficient documentation

## 2022-11-23 DIAGNOSIS — I129 Hypertensive chronic kidney disease with stage 1 through stage 4 chronic kidney disease, or unspecified chronic kidney disease: Secondary | ICD-10-CM | POA: Insufficient documentation

## 2022-11-23 MED ORDER — OXYCODONE HCL 5 MG PO TABS
5.0000 mg | ORAL_TABLET | Freq: Three times a day (TID) | ORAL | Status: DC | PRN
  Filled 2022-11-23: qty 1

## 2022-11-23 MED ORDER — MORPHINE SULFATE ER 15 MG PO TBCR
15.0000 mg | EXTENDED_RELEASE_TABLET | Freq: Once | ORAL | Status: AC
  Administered 2022-11-23: 15 mg via ORAL
  Filled 2022-11-23: qty 1

## 2022-11-23 MED ORDER — MORPHINE SULFATE ER 15 MG PO TBCR: 15.0000 mg | EXTENDED_RELEASE_TABLET | Freq: Two times a day (BID) | ORAL | 0 refills | Status: AC

## 2022-11-23 NOTE — ED Provider Notes (Signed)
[place] EMERGENCY DEPARTMENT AT [facility] Provider Note   CSN: [id number] Arrival date & time: 11/23/22  4098     History  Chief Complaint  Patient presents with   Failure To Thrive    JALIK PHARO is a 89 y.o. male.  Patient sent here by family with concern for failure to thrive, dehydration and constipation.  He currently is under hospice care for his chronic kidney disease but he lives at home where family and daughter are the primary caregivers.  Hospice comes out once a week.  He has not had a bowel movement in about a week.  He complains of pain and asks for morphine at home.  However he is not eating or drinking.  He refuses to take his medications at times.  He refused for EMS to start an IV and check his blood sugar.  He is refusing for me to start an IV here.  I talked with family on the phone and they ultimately do want [place] to try to check blood work and maybe a CT scan of his abdomen and pelvis but ultimately we will talk in person when they get here.  The wife is the power of attorney.  They would like fluids and treatment of any infection and would like treatment for things that were not too invasive.  The history is provided by the patient and a caregiver.       Home Medications Prior to Admission medications   Medication Sig Start Date End Date Taking? Authorizing Provider  Calcium Carb-Cholecalciferol (CALCIUM-VITAMIN D) 500-200 MG-UNIT tablet Take 1 tablet by mouth daily.    [provider]  cyanocobalamin 500 MCG tablet Take 500 mcg by mouth daily.    [provider]  furosemide (LASIX) 40 MG tablet Take 40 mg by mouth every morning. 09/03/22   [provider]  lisinopril-hydrochlorothiazide (ZESTORETIC) 20-25 MG tablet TAKE 1/2 TABLET BY MOUTH DAILY Patient taking differently: Take 0.5 tablets by mouth daily. 08/20/22   Etta Grandchild, MD  oxyCODONE (OXY IR/ROXICODONE) 5 MG immediate release tablet Take 1 tablet (5 mg  total) by mouth every 8 (eight) hours as needed for severe pain. 11/07/21   Etta Grandchild, MD  simvastatin (ZOCOR) 10 MG tablet TAKE 1 TABLET BY MOUTH EVERY DAY Patient taking differently: Take 10 mg by mouth daily. 01/22/22   Corwin Levins, MD  Timolol Maleate, Once-Daily, 0.5 % SOLN Apply 1 drop to eye every morning. 10/11/22   [provider]      Allergies    Penicillins and Amlodipine    Review of Systems   Review of Systems  Physical Exam Updated Vital Signs BP (!) 146/63   Pulse 91   Temp 98.3 F (36.8 C)   Ht 6' (1.829 m) Comment: Simultaneous filing. User may not have seen previous data.  Wt 81 kg Comment: Simultaneous filing. User may not have seen previous data.  SpO2 99%   BMI 24.22 kg/m  Physical Exam Vitals and nursing note reviewed.  Constitutional:      General: He is not in acute distress.    Appearance: He is well-developed.  HENT:     Head: Normocephalic and atraumatic.     Mouth/Throat:     Mouth: Mucous membranes are dry.  Eyes:     Conjunctiva/sclera: Conjunctivae normal.     Pupils: Pupils are equal, round, and reactive to light.  Cardiovascular:     Rate and Rhythm: Normal rate and  regular rhythm.     Pulses: Normal pulses.     Heart sounds: Normal heart sounds. No murmur heard. Pulmonary:     Effort: Pulmonary effort is normal. No respiratory distress.     Breath sounds: Normal breath sounds.  Abdominal:     Palpations: Abdomen is soft.     Tenderness: There is no abdominal tenderness.  Musculoskeletal:        General: No swelling.     Cervical back: Neck supple.  Skin:    General: Skin is warm and dry.     Capillary Refill: Capillary refill takes less than 2 seconds.  Neurological:     Mental Status: He is alert.  Psychiatric:        Mood and Affect: Mood normal.     ED Results / Procedures / Treatments   Labs (all labs ordered are listed, but only abnormal results are displayed) Labs Reviewed - No data to  display  EKG None  Radiology No results found.  Procedures Procedures    Medications Ordered in ED Medications  oxyCODONE (Oxy IR/ROXICODONE) immediate release tablet 5 mg (has no administration in time range)  morphine (MS CONTIN) 12 hr tablet 15 mg (15 mg Oral Given 11/23/22 2146)    ED Course/ Medical Decision Making/ A&P                                 Medical Decision Making Risk Prescription drug management.   Harriet Masson is here with failure to thrive symptoms.  History of hypertension, high cholesterol, CKD.  He arrives with unremarkable vitals.  Right now he is refusing to have an IV placed.  I talked with family.  Wife is a power of attorney.  He is under hospice care but they come to the house once a week.  He is dependent fully on family for his care.  He has been refusing to eat and drink.  Not taking medications.  They are concerned that he is dehydrated and constipated.  Ultimately I have talked with family extensively about if he is full comfort care or if he is still someone that we would try to pursue treatments for if things were not invasive.  Ultimately it sounds like family does want to check if he is dehydrated and maybe get a CT scan of his abdomen and pelvis.  I will await for family to arrive to try to make final decisions about his care.  I have engaged social work and case management about support to see if we can provide more support daily at home.  Overall I have had prolonged conversations with both social work, case management and the family including wife.  Ultimately patient does not want to have any further testing or imaging.  He only wants to take pain medicine.  Family wants to respect these wishes and trust that what he is saying is the truth.  After extensive conversation we will continue to pursue comfort care and I have ordered morphine p.o. scheduled and oxycodone for breakthrough pain.  They have talked with social worker about various  options for home health.  He clinically appears to be doing well and it does not sound like he qualifies for inpatient hospice or hospice at home.  Social worker is hopeful that they can get several hours of home health a day and then apply for Medicaid and then hopefully transition him to a long-term  care facility.  Ultimately the plan is for him to board overnight given that it is too late for social work to put these things in process.  Family will go home and get rest and come back in the morning and reengage in conversations about long-term care plans for the patient.  We are going to hold any other chronic medications at this time.  We are not can to pursue labs or imaging.  I think it would be great if the palliative care team was available to come to the ED tomorrow but not sure if they will be available.  Ultimately social work provider tomorrow will need to reengage with family about plan which will hopefully be discharged to home with home health.  I have already sent in a prescription for morphine and they already have a prescription for oxycodone and they understand pain plan and will allow hospice/palliative care to continue that pain management outpatient.  This chart was dictated using voice recognition software.  Despite best efforts to proofread,  errors can occur which can change the documentation meaning.         Final Clinical Impression(s) / ED Diagnoses Final diagnoses:  Failure to thrive in adult    Rx / DC Orders ED Discharge Orders     None         Virgina Norfolk, DO 11/23/22 2235

## 2022-11-23 NOTE — Progress Notes (Addendum)
This CSW spoke with stephanie on call nurse at [place] of the hospice. AT this time the patient is currently under Palliative care as well is seeing [facility]. Judeth Cornfield mentioned that they will only see the patient once a week and seen the patient this past Wednesday the 28 th.   This CSW spoke to the patient's daughter about the patient. AT this time daughter also reports dad is being seen once a week by [facility] of the hospice and they also pay for [facility]. Daughter reports that the care is not enough. AT this time the patient needs 24 hour care per the daughter. CSW explained to the daughter that the patient can have home health if family is no longer wanting hospice she feels its not enough care.   CSW also explained to the daughter that the patient will need LTC not SNF. Daughter does understand and is aware that the patient will DC home tomorrow with [facility]. Daughter is also aware that she is able to have the patient work with hospice again in the future. AT this time TOC will continue to follow patient.

## 2022-11-23 NOTE — ED Notes (Signed)
Pt refused meds after scanned

## 2022-11-23 NOTE — ED Triage Notes (Signed)
Patient brought in from home by EMS for failure to thrive. Per EMS patient is a hospice patient at [facility]. Family was concerned because patient has been constipated and dehydrated for a few days now. Hospice can not assess patient until Tuesday. Patient refused to let EMS start line and check CBG. 130/84 72 96% RA

## 2022-11-23 NOTE — Discharge Instructions (Signed)
Continue to engage in hospice for chronic pain management.  I recommend doing morphine tablet every 12 hours as prescribed.  Use Roxicodone 5 mg every 8 hours as needed for breakthrough pain.

## 2022-11-23 NOTE — ED Notes (Signed)
Pt is currently refusing care and vitals.

## 2022-11-23 NOTE — Progress Notes (Signed)
Pt is a current active patient with [facility]. His nurse made a visit on Wednesday and found he has been non compliant with his medications and educated him and family on bowel regimen.  His nurse visited today and found he has not been compliant with bowel regimen instructions and he refused to be dis-impacted. Family is aware they can call Hospice anytime 24/7 if they need a nurse to evaluate him.   Feel free to call if you have any questions or concerns. Evette Doffing, [facility] [phone]

## 2022-11-23 NOTE — ED Notes (Signed)
Patient refused to dress in gown and refused answering questions

## 2022-11-24 ENCOUNTER — Encounter (HOSPITAL_COMMUNITY): Payer: Self-pay

## 2022-11-24 MED ORDER — MORPHINE SULFATE ER 15 MG PO TBCR
15.0000 mg | EXTENDED_RELEASE_TABLET | Freq: Two times a day (BID) | ORAL | Status: DC
  Administered 2022-11-24 (×2): 15 mg via ORAL
  Filled 2022-11-24 (×2): qty 1

## 2022-11-24 NOTE — ED Provider Notes (Signed)
Emergency Medicine Observation Re-evaluation Note  Cory Leonard is a 89 y.o. male, seen on rounds today.  Pt initially presented to the ED for complaints of Failure To Thrive Currently, the patient is resting.  Physical Exam  BP 132/64   Pulse 99   Temp 98 F (36.7 C) (Oral)   Resp 16   Ht 6' (1.829 m) Comment: Simultaneous filing. User may not have seen previous data.  Wt 81 kg Comment: Simultaneous filing. User may not have seen previous data.  SpO2 100%   BMI 24.22 kg/m  Physical Exam General: NAD   ED Course / MDM  EKG:   I have reviewed the labs performed to date as well as medications administered while in observation.  Recent changes in the last 24 hours include no acute events reported.  Plan  Current plan is for placement/LTC?Marland Kitchen Possible DC today with HH?    Wynetta Fines, MD 11/24/22 [phone]

## 2022-11-24 NOTE — ED Notes (Signed)
Patient is resting comfortably. 

## 2022-11-24 NOTE — Progress Notes (Addendum)
[facility] Liaison Note  Received a phone call from daughter Marylynn Pearson requesting help with care for her father who was transferred to the ED last evening. Patient is currently active with [facility] and family is requesting respite stay. Consuella Lose states she cannot work and carry her mother to see her father in an inpatient unit in [place], they are needing rest and time to arrange 24 hour caregivers in the home. Discussed transfer of benefit with [facility] with [facility]. She is agreeable and is gathering needed paperwork.  Discussed respite stay and hospice services with patient, daughter, Consuella Lose and wife Suzan Garibaldi. They understand transfer to [facility] is for a 5 day respite stay allowing them time for rest and to arrange in home caregiving for patient. They understand we are not 24 hour care in the home. Patient is agreeable for transfer to [facility].  Once paperwork received from [facility], consents will need to be signed and then I will call [facility] for transport.  1830: Addendum: [facility] is unable to provide [place] with needed paperwork to transfer the Hospice Medicare Benefit. Notified TOC, RN and family. Plan is for discharge this evening.  Please call with any questions or concerns.  Thank you, Haynes Bast, BSN, [facility]  [phone]

## 2022-11-24 NOTE — Progress Notes (Signed)
11/24/2022  1848  Patient c/o pain 7/10. Patient refuses the oxy and requesting morphine. Patient is aware the morphine is twice a day and next dose is not until 11pm. Pt states the morphine should be more frequent. Notified MD to see if meds can be adjusted.

## 2022-11-24 NOTE — Progress Notes (Signed)
CSW reached out to [employer] in efforts to help the family receive more in home help. Kasie from [employer] agreed to work with the family however French Ana from [facility] reached out stating that the family reached out to them with concerns about the patient. At this time French Ana has agreed to take the patient into emergency respite if the patient Is willing to go. This CSW received a call from the daughter reporting they were on the way to the ED to talk to the father about going to emergency respite as the family would like to  talk to dad about those options. At this time current RN reports family did speak to dad / patient  and he has agreed to go. French Ana did inform this CSW that the patient will continue to stay in the ED during this transition. At this time we are awaiting tracy to come and review/ speak with patient. This CSW has also reached back  out to the daughter to inform her that if the father changes his mind he will DC home with the provided resources. This CSW explained LTC process as well letting family know they would still be able to work with [employer] as they provide an AID 5 days a week. At this time CSW is working closely with family as well providers to have patient transition to next plan of care. Barriers to the patient's DC is family can not care for patient 24/7 and they are needing more help. TOC is working on providing the resources family needs.

## 2022-11-24 NOTE — Progress Notes (Signed)
  Daily Progress Note   Patient Name: COLTIN HASLEM       Date: 11/24/2022 DOB: February 05, 1929  Age: 89 y.o. MRN#: [id number] Attending Physician: System, Provider Not In Primary Care Physician: Etta Grandchild, MD Admit Date: 11/23/2022 Length of Stay: 0 days  PMT consult received overnight. Upon EMR review today, appears patient had been at home with family with hospice support via [facility]. Patient's family now requesting care care assistance in the home to help with hospice support. Daughter has reached out to [facility] hospice about emergency respite care in [place] since difficult for patient's wife to be transported to [facility] inpatient hospice. Plan has already been arrange for patient to transfer to [facility] for respite care for 5 days to allow family time to arrange more home care providers to continue hospice at home. Discussed with [facility] hospice liaison who acknowledges plan to transfer to [facility] soon for respite care. No PMT team requirements/assistance requested from [facility] hospice at this time. Since goals for medical care currently determined, will discontinue PMT consult. Please reach out if PMT can be of assistance in the future. Thank you.   Alvester Morin, DO Palliative Care Provider PMT # [phone]

## 2022-11-24 NOTE — ED Notes (Signed)
During the entire shift pt keep removing vitals equipments
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
## 2022-11-24 NOTE — Progress Notes (Signed)
11/24/2022  1820  Called EMS [phone] for transport home. Per EMS it may be a while, there are more patients on the list for transport.

## 2022-11-28 ENCOUNTER — Ambulatory Visit: Payer: Medicare Other | Admitting: Internal Medicine

## 2022-12-25 DEATH — deceased
